# Patient Record
Sex: Female | Born: 1945 | Race: White | Hispanic: No | Marital: Married | Smoking: Former smoker
Health system: Southern US, Community
[De-identification: ages and names within clinical notes are randomized; demographics above are authoritative.]

## PROBLEM LIST (undated history)

## (undated) DIAGNOSIS — G4700 Insomnia, unspecified: Secondary | ICD-10-CM

## (undated) DIAGNOSIS — M1990 Unspecified osteoarthritis, unspecified site: Secondary | ICD-10-CM

## (undated) DIAGNOSIS — H269 Unspecified cataract: Secondary | ICD-10-CM

## (undated) DIAGNOSIS — R011 Cardiac murmur, unspecified: Secondary | ICD-10-CM

## (undated) DIAGNOSIS — R350 Frequency of micturition: Secondary | ICD-10-CM

## (undated) DIAGNOSIS — J45909 Unspecified asthma, uncomplicated: Secondary | ICD-10-CM

## (undated) DIAGNOSIS — Z9981 Dependence on supplemental oxygen: Secondary | ICD-10-CM

## (undated) DIAGNOSIS — R0602 Shortness of breath: Secondary | ICD-10-CM

## (undated) DIAGNOSIS — Z8619 Personal history of other infectious and parasitic diseases: Secondary | ICD-10-CM

## (undated) DIAGNOSIS — Z8669 Personal history of other diseases of the nervous system and sense organs: Secondary | ICD-10-CM

## (undated) DIAGNOSIS — J209 Acute bronchitis, unspecified: Secondary | ICD-10-CM

## (undated) DIAGNOSIS — J449 Chronic obstructive pulmonary disease, unspecified: Secondary | ICD-10-CM

## (undated) DIAGNOSIS — M2550 Pain in unspecified joint: Secondary | ICD-10-CM

## (undated) DIAGNOSIS — K219 Gastro-esophageal reflux disease without esophagitis: Secondary | ICD-10-CM

## (undated) DIAGNOSIS — R351 Nocturia: Secondary | ICD-10-CM

## (undated) DIAGNOSIS — G4730 Sleep apnea, unspecified: Secondary | ICD-10-CM

## (undated) DIAGNOSIS — M2540 Effusion, unspecified joint: Secondary | ICD-10-CM

## (undated) HISTORY — PX: ABDOMINAL HYSTERECTOMY: SHX81

---

## 2002-12-25 ENCOUNTER — Other Ambulatory Visit: Payer: Self-pay

## 2003-09-24 ENCOUNTER — Other Ambulatory Visit: Payer: Self-pay

## 2004-03-19 ENCOUNTER — Emergency Department: Payer: Self-pay | Admitting: Unknown Physician Specialty

## 2004-04-27 ENCOUNTER — Inpatient Hospital Stay: Payer: Self-pay | Admitting: Internal Medicine

## 2004-04-27 ENCOUNTER — Other Ambulatory Visit: Payer: Self-pay

## 2005-07-21 ENCOUNTER — Emergency Department: Payer: Self-pay | Admitting: Emergency Medicine

## 2005-07-25 ENCOUNTER — Emergency Department: Payer: Self-pay | Admitting: Emergency Medicine

## 2006-01-30 ENCOUNTER — Other Ambulatory Visit: Payer: Self-pay

## 2006-01-30 ENCOUNTER — Emergency Department: Payer: Self-pay | Admitting: Emergency Medicine

## 2006-04-23 ENCOUNTER — Other Ambulatory Visit: Payer: Self-pay

## 2006-04-23 ENCOUNTER — Emergency Department: Payer: Self-pay | Admitting: Emergency Medicine

## 2007-04-18 ENCOUNTER — Emergency Department (HOSPITAL_COMMUNITY): Admission: EM | Admit: 2007-04-18 | Discharge: 2007-04-18 | Payer: Self-pay | Admitting: Emergency Medicine

## 2007-04-27 ENCOUNTER — Emergency Department (HOSPITAL_COMMUNITY): Admission: EM | Admit: 2007-04-27 | Discharge: 2007-04-27 | Payer: Self-pay | Admitting: Emergency Medicine

## 2007-07-30 ENCOUNTER — Ambulatory Visit: Payer: Self-pay | Admitting: Specialist

## 2007-09-04 ENCOUNTER — Emergency Department: Payer: Self-pay | Admitting: Unknown Physician Specialty

## 2007-09-16 ENCOUNTER — Emergency Department: Payer: Self-pay | Admitting: Emergency Medicine

## 2008-06-09 ENCOUNTER — Emergency Department (HOSPITAL_COMMUNITY): Admission: EM | Admit: 2008-06-09 | Discharge: 2008-06-09 | Payer: Self-pay | Admitting: Emergency Medicine

## 2008-06-18 ENCOUNTER — Emergency Department (HOSPITAL_COMMUNITY): Admission: EM | Admit: 2008-06-18 | Discharge: 2008-06-18 | Payer: Self-pay | Admitting: Emergency Medicine

## 2008-09-27 ENCOUNTER — Emergency Department (HOSPITAL_COMMUNITY): Admission: EM | Admit: 2008-09-27 | Discharge: 2008-09-27 | Payer: Self-pay | Admitting: Emergency Medicine

## 2009-04-01 IMAGING — CT CT HEAD W/O CM
1 of 2 series · 16 of 30 positions shown, 20 images · IV contrast (agent unspecified)
Comparison: none

CLINICAL DATA: Altered loss of consciousness. 
 HEAD CT WITHOUT CONTRAST:
TECHNIQUE: Contiguous axial images were obtained from the base of the skull through the vertex according to standard protocol without contrast.

[Series 2: head_seq 4.5 h37s st · axial · 0.43mm/px · z∈[-131,-1]mm · 16 of 32 slices shown, 20 images]
[im 2/32  brain]
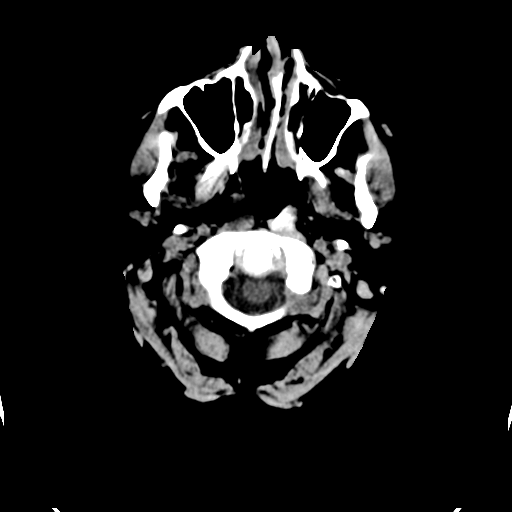
[im 2/32  bone]
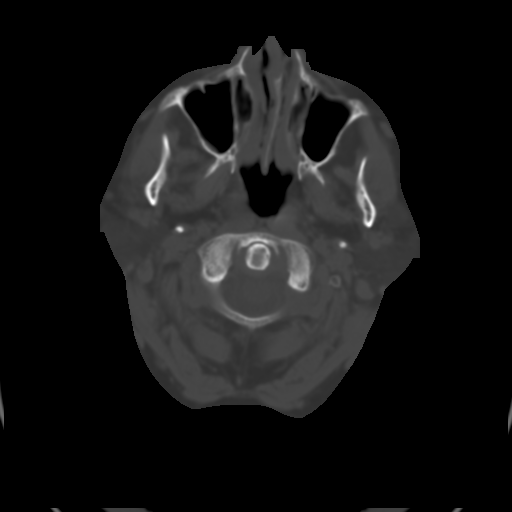
[im 4/32  brain]
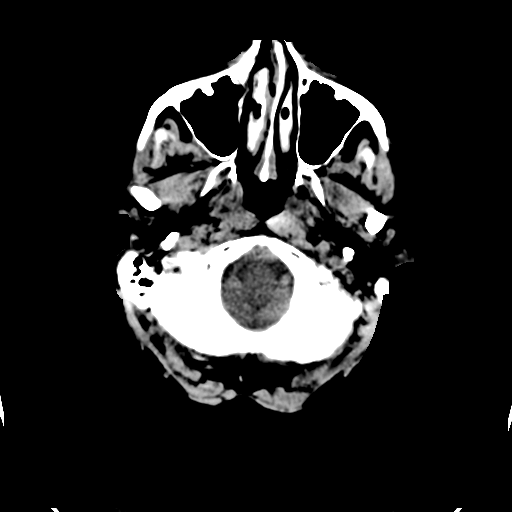
[im 5/32  brain]
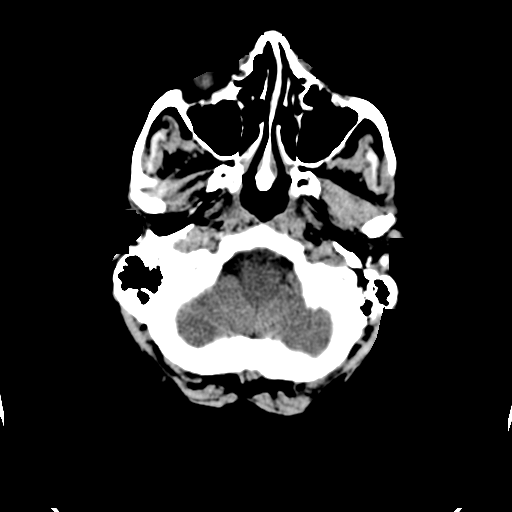
[im 8/32  brain]
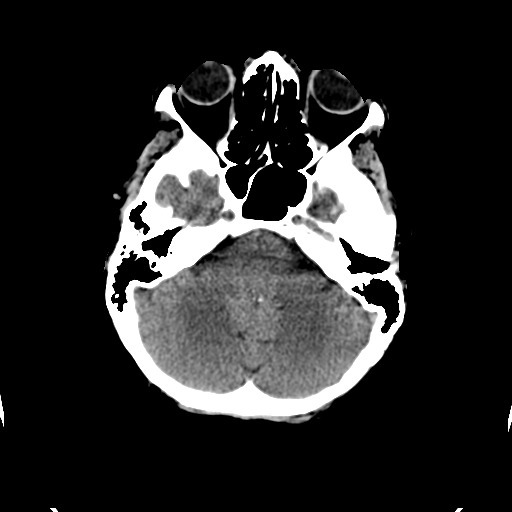
[im 10/32  brain]
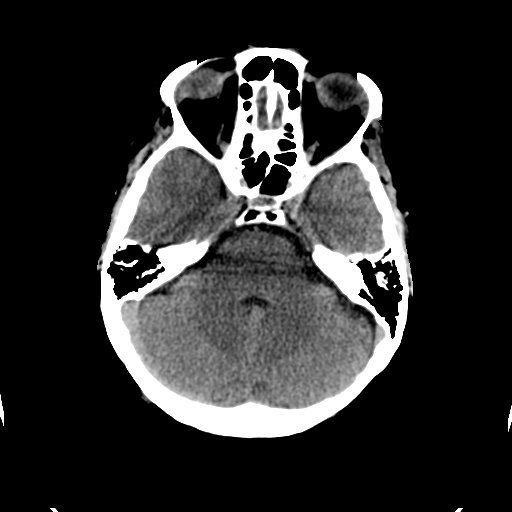
[im 10/32  bone]
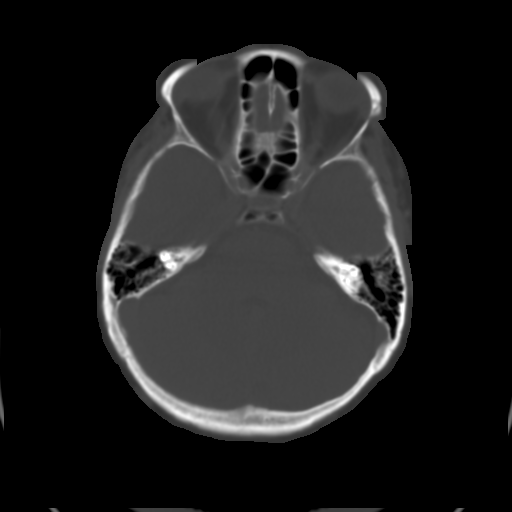
[im 11/32  brain]
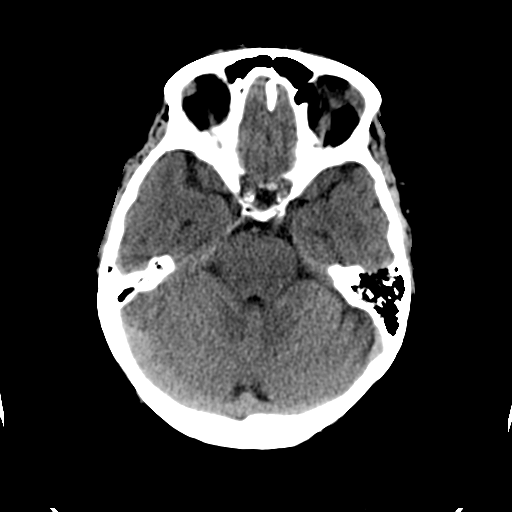
[im 14/32  brain]
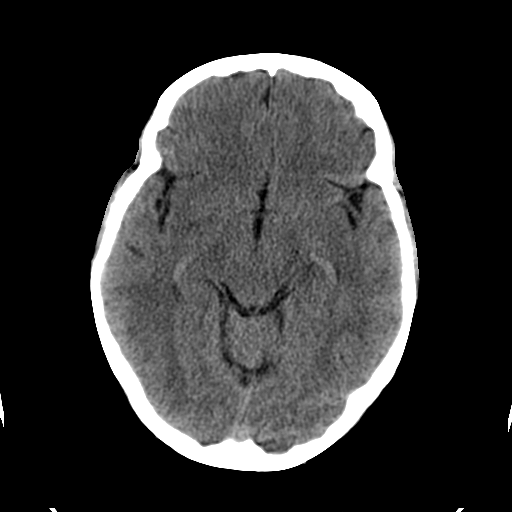
[im 15/32  brain]
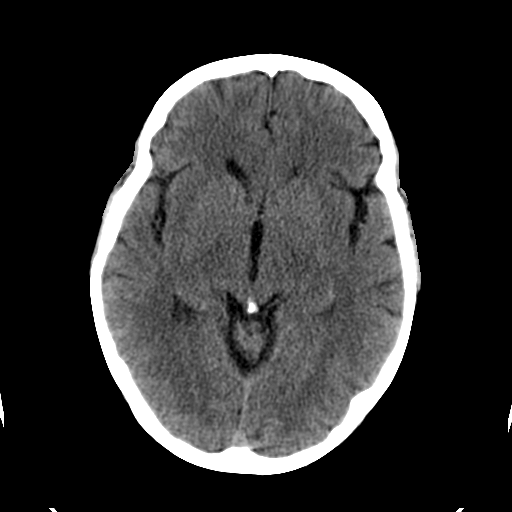
[im 17/32  brain]
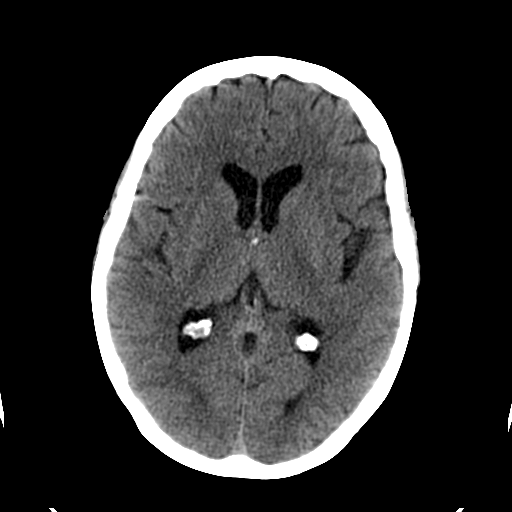
[im 17/32  bone]
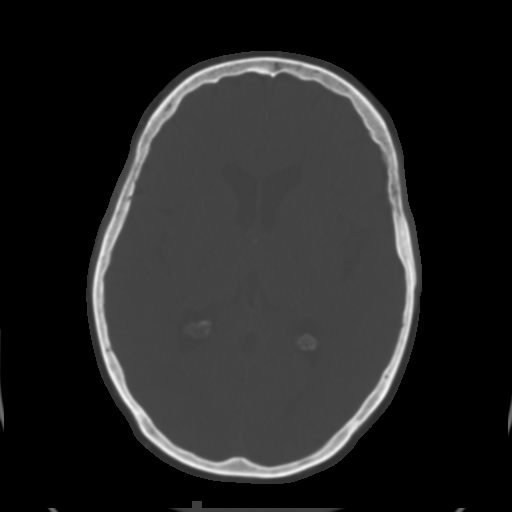
[im 18/32  brain]
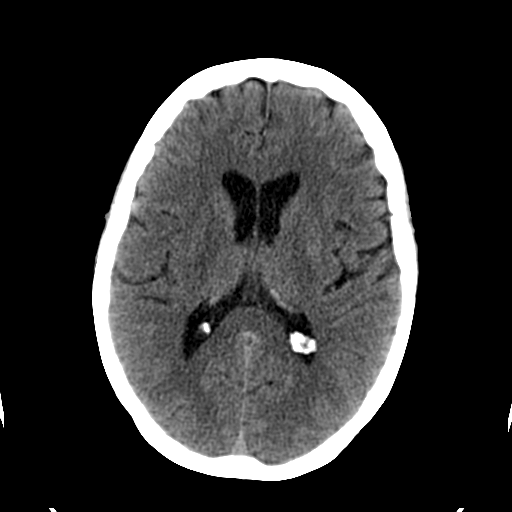
[im 21/32  brain]
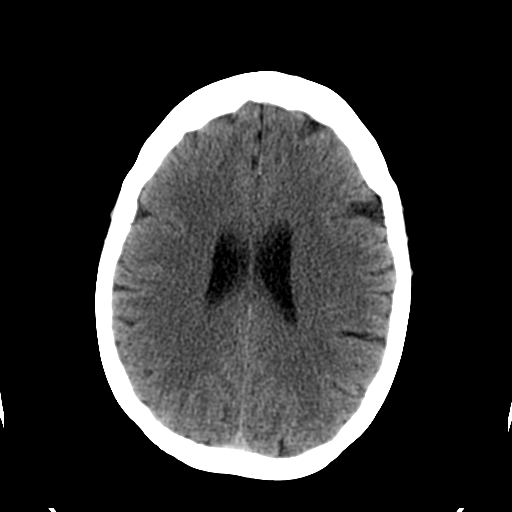
[im 22/32  brain]
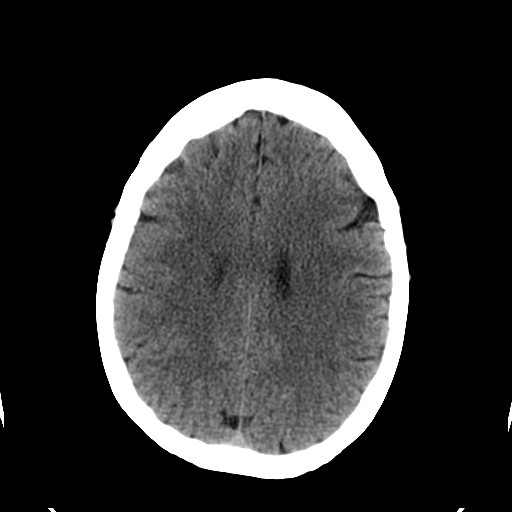
[im 24/32  brain]
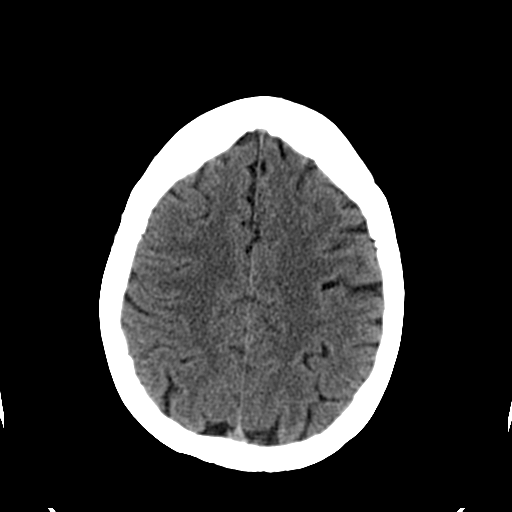
[im 24/32  bone]
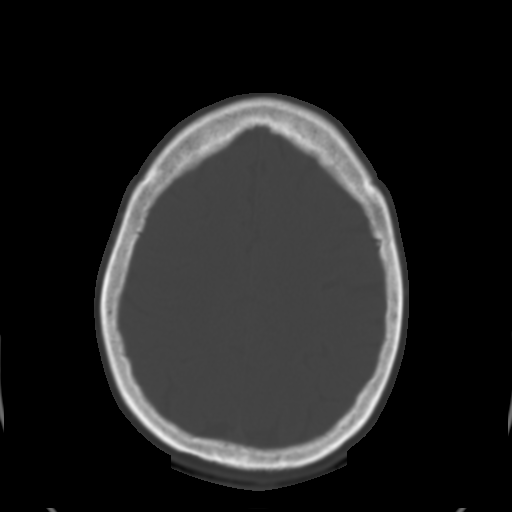
[im 27/32  brain]
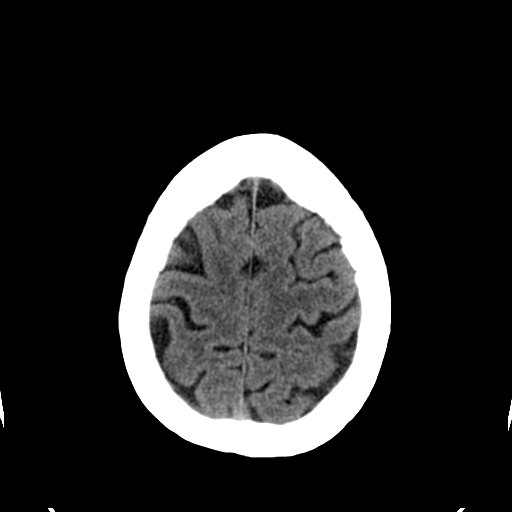
[im 28/32  brain]
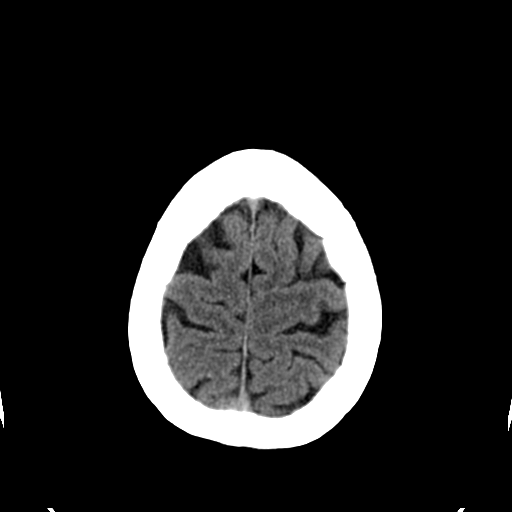
[im 30/32  brain]
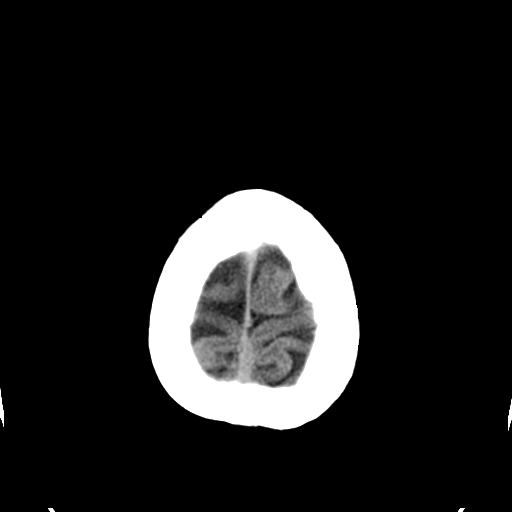

[16 of 30 positions shown; findings below may reference images not displayed]

FINDINGS: No intracranial hemorrhage.  No CT evidence of large acute infarct.  No hydrocephalus.  Cerebellar tonsils slightly low-lying.  Visualized sinuses and mastoid air cells are clear.
IMPRESSION: 1.  No intracranial hemorrhage.  
 2.  No CT evidence of large acute infarct. 
 3.  Cerebellar tonsils slightly low-lying.
 4.  No intracranial mass detected on this unenhanced examination.

## 2009-04-08 ENCOUNTER — Emergency Department (HOSPITAL_COMMUNITY): Admission: EM | Admit: 2009-04-08 | Discharge: 2009-04-09 | Payer: Self-pay | Admitting: Emergency Medicine

## 2009-09-01 ENCOUNTER — Emergency Department (HOSPITAL_COMMUNITY): Admission: EM | Admit: 2009-09-01 | Discharge: 2009-09-01 | Payer: Self-pay | Admitting: Emergency Medicine

## 2010-05-28 LAB — CBC
MCV: 96.1 fL (ref 78.0–100.0)
RBC: 3.95 MIL/uL (ref 3.87–5.11)
WBC: 4.6 10*3/uL (ref 4.0–10.5)

## 2010-05-28 LAB — DIFFERENTIAL
Basophils Absolute: 0 10*3/uL (ref 0.0–0.1)
Eosinophils Relative: 6 % — ABNORMAL HIGH (ref 0–5)
Lymphs Abs: 2 10*3/uL (ref 0.7–4.0)
Monocytes Absolute: 0.5 10*3/uL (ref 0.1–1.0)

## 2010-11-11 LAB — BASIC METABOLIC PANEL
CO2: 28
Chloride: 105
Creatinine, Ser: 0.69
GFR calc Af Amer: >60
GFR calc non Af Amer: >60

## 2010-11-11 LAB — RAPID URINE DRUG SCREEN, HOSP PERFORMED
Barbiturates: NOT DETECTED
Benzodiazepines: NOT DETECTED
Cocaine: NOT DETECTED
Opiates: NOT DETECTED

## 2010-11-11 LAB — POCT CARDIAC MARKERS
Myoglobin, poc: 44.4
Operator id: 4001

## 2010-11-11 LAB — DIFFERENTIAL
Basophils Relative: 1
Eosinophils Relative: 7 — ABNORMAL HIGH
Lymphs Abs: 2.1
Monocytes Absolute: 0.7
Monocytes Relative: 10
Neutrophils Relative %: 54

## 2010-11-11 LAB — URINE MICROSCOPIC-ADD ON

## 2010-11-11 LAB — CBC: HCT: 40.7

## 2010-11-11 LAB — URINALYSIS, ROUTINE W REFLEX MICROSCOPIC
Hgb urine dipstick: NEGATIVE
Protein, ur: NEGATIVE
Urobilinogen, UA: 0.2

## 2010-11-14 LAB — POCT CARDIAC MARKERS
CKMB, poc: 1.4
Myoglobin, poc: 60.1
Troponin i, poc: <0.05

## 2010-11-14 LAB — POCT I-STAT CREATININE: Creatinine, Ser: 1

## 2010-11-14 LAB — I-STAT 8, (EC8 V) (CONVERTED LAB)
BUN: 14
Bicarbonate: 22.6
HCT: 45
Hemoglobin: 15.3 — ABNORMAL HIGH
Potassium: 3.3 — ABNORMAL LOW

## 2010-11-14 LAB — CBC
Hemoglobin: 14.4
MCHC: 34.3
MCV: 93.4
RDW: 12.1
WBC: 8.5

## 2010-11-14 LAB — DIFFERENTIAL: Lymphocytes Relative: 23

## 2011-07-25 ENCOUNTER — Ambulatory Visit: Payer: Self-pay | Admitting: Family Medicine

## 2011-11-16 ENCOUNTER — Emergency Department: Payer: Self-pay | Admitting: Emergency Medicine

## 2011-11-16 LAB — BASIC METABOLIC PANEL
Anion Gap: 9 (ref 7–16)
BUN: 12 mg/dL (ref 7–18)
Calcium, Total: 8.8 mg/dL (ref 8.5–10.1)
Co2: 28 mmol/L (ref 21–32)
EGFR (African American): >60
EGFR (Non-African Amer.): >60
Glucose: 76 mg/dL (ref 65–99)
Osmolality: 285 (ref 275–301)
Sodium: 144 mmol/L (ref 136–145)

## 2011-11-16 LAB — CBC
HGB: 12.8 g/dL (ref 12.0–16.0)
MCV: 93 fL (ref 80–100)
RBC: 3.99 10*6/uL (ref 3.80–5.20)

## 2011-11-16 LAB — CK TOTAL AND CKMB (NOT AT ARMC)
CK, Total: 74 U/L (ref 21–215)
CK-MB: 0.9 ng/mL (ref 0.5–3.6)

## 2011-11-16 LAB — PROTIME-INR
INR: 1.1
Prothrombin Time: 14.2 secs (ref 11.5–14.7)

## 2012-09-04 ENCOUNTER — Emergency Department: Payer: Self-pay | Admitting: Emergency Medicine

## 2012-10-16 ENCOUNTER — Emergency Department: Payer: Self-pay | Admitting: Internal Medicine

## 2012-10-16 LAB — DRUG SCREEN, URINE
Amphetamines, Ur Screen: NEGATIVE (ref ?–1000)
Barbiturates, Ur Screen: NEGATIVE (ref ?–200)
Benzodiazepine, Ur Scrn: NEGATIVE (ref ?–200)
Opiate, Ur Screen: NEGATIVE (ref ?–300)
Phencyclidine (PCP) Ur S: NEGATIVE (ref ?–25)
Tricyclic, Ur Screen: NEGATIVE (ref ?–1000)

## 2012-10-16 LAB — CBC
HCT: 37.2 % (ref 35.0–47.0)
HGB: 12.9 g/dL (ref 12.0–16.0)
MCH: 31.3 pg (ref 26.0–34.0)
MCHC: 34.5 g/dL (ref 32.0–36.0)
MCV: 91 fL (ref 80–100)
RBC: 4.11 10*6/uL (ref 3.80–5.20)
WBC: 3.7 10*3/uL (ref 3.6–11.0)

## 2012-10-16 LAB — COMPREHENSIVE METABOLIC PANEL
Alkaline Phosphatase: 86 U/L (ref 50–136)
Anion Gap: 3 — ABNORMAL LOW (ref 7–16)
Calcium, Total: 8.7 mg/dL (ref 8.5–10.1)
Chloride: 110 mmol/L — ABNORMAL HIGH (ref 98–107)
Creatinine: 0.58 mg/dL — ABNORMAL LOW (ref 0.60–1.30)
EGFR (African American): >60
EGFR (Non-African Amer.): >60
Osmolality: 282 (ref 275–301)
Potassium: 3.5 mmol/L (ref 3.5–5.1)
SGOT(AST): 16 U/L (ref 15–37)
Sodium: 141 mmol/L (ref 136–145)
Total Protein: 6.5 g/dL (ref 6.4–8.2)

## 2012-10-16 LAB — URINALYSIS, COMPLETE
Bacteria: NONE SEEN
Bilirubin,UR: NEGATIVE
Nitrite: NEGATIVE
Ph: 5 (ref 4.5–8.0)
Protein: NEGATIVE
RBC,UR: <1 /HPF (ref 0–5)
Squamous Epithelial: 2
WBC UR: 2 /HPF (ref 0–5)

## 2012-10-16 LAB — CK TOTAL AND CKMB (NOT AT ARMC)
CK, Total: 60 U/L (ref 21–215)
CK-MB: 0.8 ng/mL (ref 0.5–3.6)

## 2012-10-16 LAB — TROPONIN I: Troponin-I: <0.02 ng/mL

## 2013-03-03 ENCOUNTER — Encounter (HOSPITAL_COMMUNITY): Payer: Self-pay | Admitting: Pharmacy Technician

## 2013-03-03 ENCOUNTER — Other Ambulatory Visit: Payer: Self-pay | Admitting: Orthopedic Surgery

## 2013-03-07 ENCOUNTER — Inpatient Hospital Stay (HOSPITAL_COMMUNITY)
Admission: RE | Admit: 2013-03-07 | Discharge: 2013-03-07 | Disposition: A | Discharge status: Home / Self Care | Payer: Self-pay | Source: Ambulatory Visit

## 2013-03-07 NOTE — Pre-Procedure Instructions (Signed)
Madison Frank  03/07/2013   Your procedure is scheduled on:  Monday, March 17, 2013 @ 7:30 AM  Report to [facility] (use Main Entrance "A'') at 5:30 AM.  Call this number if you have problems the morning of surgery: [phone]   Remember:   Do not eat food or drink liquids after midnight.   Take these medicines the morning of surgery with A SIP OF WATER: If needed:traMADol (ULTRAM) 50 MG tablet for moderate pain. Stop taking Aspirin, vitamins and herbal medications. Do not take any NSAIDs ie: Ibuprofen, Advil, Naproxen or any medication containing Aspirin.  Do not wear jewelry, make-up or nail polish.  Do not wear lotions, powders, or perfumes. You may wear deodorant.  Do not shave 48 hours prior to surgery.  Do not bring valuables to the hospital.  [facility] is not responsible for any belongings or valuables.               Contacts, dentures or bridgework may not be worn into surgery.  Leave suitcase in the car. After surgery it may be brought to your room.  For patients admitted to the hospital, discharge time is determined by your treatment team.               Patients discharged the day of surgery will not be allowed to drive home.  Name and phone number of your driver:  Special Instructions: Shower using CHG 2 nights before surgery and the night before surgery.  If you shower the day of surgery use CHG.  Use special wash - you have one bottle of CHG for all showers.  You should use approximately 1/3 of the bottle for each shower.   Please read over the following fact sheets that you were given: Pain Booklet, Coughing and Deep Breathing, Blood Transfusion Information, Total Joint Packet, MRSA Information and Surgical Site Infection Prevention

## 2013-03-07 NOTE — Progress Notes (Signed)
Pt scheduled for PAT today at 10:00AM. Pt did not show or call to cancel appointment.  Several unsuccessful attempts were made to contact pt at home phone however, pt phone line gave a "busy" signal.

## 2013-03-13 ENCOUNTER — Encounter (HOSPITAL_COMMUNITY)
Admission: RE | Admit: 2013-03-13 | Discharge: 2013-03-13 | Disposition: A | Discharge status: Home / Self Care | Payer: Medicare Other | Source: Ambulatory Visit | Attending: Orthopedic Surgery | Admitting: Orthopedic Surgery

## 2013-03-13 ENCOUNTER — Encounter (HOSPITAL_COMMUNITY): Payer: Self-pay

## 2013-03-13 DIAGNOSIS — Z01811 Encounter for preprocedural respiratory examination: Secondary | ICD-10-CM | POA: Insufficient documentation

## 2013-03-13 DIAGNOSIS — Z01810 Encounter for preprocedural cardiovascular examination: Secondary | ICD-10-CM | POA: Insufficient documentation

## 2013-03-13 DIAGNOSIS — Z01818 Encounter for other preprocedural examination: Secondary | ICD-10-CM | POA: Insufficient documentation

## 2013-03-13 DIAGNOSIS — Z01812 Encounter for preprocedural laboratory examination: Secondary | ICD-10-CM | POA: Insufficient documentation

## 2013-03-13 HISTORY — DX: Gastro-esophageal reflux disease without esophagitis: K21.9

## 2013-03-13 HISTORY — DX: Sleep apnea, unspecified: G47.30

## 2013-03-13 HISTORY — DX: Acute bronchitis, unspecified: J20.9

## 2013-03-13 HISTORY — DX: Dependence on supplemental oxygen: Z99.81

## 2013-03-13 HISTORY — DX: Nocturia: R35.1

## 2013-03-13 HISTORY — DX: Shortness of breath: R06.02

## 2013-03-13 HISTORY — DX: Chronic obstructive pulmonary disease, unspecified: J44.9

## 2013-03-13 HISTORY — DX: Cardiac murmur, unspecified: R01.1

## 2013-03-13 HISTORY — DX: Frequency of micturition: R35.0

## 2013-03-13 HISTORY — DX: Personal history of other diseases of the nervous system and sense organs: Z86.69

## 2013-03-13 HISTORY — DX: Personal history of other infectious and parasitic diseases: Z86.19

## 2013-03-13 HISTORY — DX: Unspecified osteoarthritis, unspecified site: M19.90

## 2013-03-13 HISTORY — DX: Pain in unspecified joint: M25.50

## 2013-03-13 HISTORY — DX: Unspecified cataract: H26.9

## 2013-03-13 HISTORY — DX: Insomnia, unspecified: G47.00

## 2013-03-13 HISTORY — DX: Effusion, unspecified joint: M25.40

## 2013-03-13 HISTORY — DX: Unspecified asthma, uncomplicated: J45.909

## 2013-03-13 LAB — TYPE AND SCREEN
ABO/RH(D): AB POS
ANTIBODY SCREEN: NEGATIVE

## 2013-03-13 LAB — COMPREHENSIVE METABOLIC PANEL
ALT: 11 U/L (ref 0–35)
AST: 17 U/L (ref 0–37)
Albumin: 3.8 g/dL (ref 3.5–5.2)
Alkaline Phosphatase: 76 U/L (ref 39–117)
BUN: 13 mg/dL (ref 6–23)
CALCIUM: 8.8 mg/dL (ref 8.4–10.5)
CO2: 25 mEq/L (ref 19–32)
Chloride: 104 mEq/L (ref 96–112)
Creatinine, Ser: 0.55 mg/dL (ref 0.50–1.10)
GFR calc Af Amer: >90 mL/min (ref >90)
GFR calc non Af Amer: >90 mL/min (ref >90)
Glucose, Bld: 82 mg/dL (ref 70–99)
Potassium: 3.8 mEq/L (ref 3.7–5.3)
Sodium: 142 mEq/L (ref 137–147)
TOTAL PROTEIN: 7.3 g/dL (ref 6.0–8.3)
Total Bilirubin: <0.2 mg/dL — ABNORMAL LOW (ref 0.3–1.2)

## 2013-03-13 LAB — CBC WITH DIFFERENTIAL/PLATELET
BASOS PCT: 1 % (ref 0–1)
Basophils Absolute: 0 10*3/uL (ref 0.0–0.1)
EOS ABS: 0.3 10*3/uL (ref 0.0–0.7)
Eosinophils Relative: 5 % (ref 0–5)
HEMATOCRIT: 40.3 % (ref 36.0–46.0)
Hemoglobin: 13.8 g/dL (ref 12.0–15.0)
Lymphocytes Relative: 33 % (ref 12–46)
Lymphs Abs: 1.9 10*3/uL (ref 0.7–4.0)
MCH: 31.2 pg (ref 26.0–34.0)
MCHC: 34.2 g/dL (ref 30.0–36.0)
MCV: 91 fL (ref 78.0–100.0)
MONOS PCT: 9 % (ref 3–12)
Monocytes Absolute: 0.5 10*3/uL (ref 0.1–1.0)
Neutro Abs: 2.9 10*3/uL (ref 1.7–7.7)
Neutrophils Relative %: 52 % (ref 43–77)
Platelets: 274 10*3/uL (ref 150–400)
RBC: 4.43 MIL/uL (ref 3.87–5.11)
RDW: 13.1 % (ref 11.5–15.5)
WBC: 5.6 10*3/uL (ref 4.0–10.5)

## 2013-03-13 LAB — URINALYSIS, ROUTINE W REFLEX MICROSCOPIC
BILIRUBIN URINE: NEGATIVE
Glucose, UA: NEGATIVE mg/dL
Hgb urine dipstick: NEGATIVE
Ketones, ur: NEGATIVE mg/dL
Leukocytes, UA: NEGATIVE
NITRITE: NEGATIVE
Protein, ur: NEGATIVE mg/dL
Specific Gravity, Urine: 1.026 (ref 1.005–1.030)
UROBILINOGEN UA: 0.2 mg/dL (ref 0.0–1.0)
pH: 6 (ref 5.0–8.0)

## 2013-03-13 LAB — PROTIME-INR
INR: 1.04 (ref 0.00–1.49)
PROTHROMBIN TIME: 13.4 s (ref 11.6–15.2)

## 2013-03-13 LAB — SURGICAL PCR SCREEN
MRSA, PCR: NEGATIVE
STAPHYLOCOCCUS AUREUS: NEGATIVE

## 2013-03-13 LAB — APTT: aPTT: 29 seconds (ref 24–37)

## 2013-03-13 LAB — ABO/RH: ABO/RH(D): AB POS

## 2013-03-13 MED ORDER — CHLORHEXIDINE GLUCONATE 4 % EX LIQD: 60.0000 mL | Freq: Once | CUTANEOUS | Status: DC

## 2013-03-13 NOTE — Pre-Procedure Instructions (Signed)
Cathleen FearsVickie L Renshaw  03/13/2013   Your procedure is scheduled on:  Mon, Jan 26 @ 7:30 AM  [facility] En[facility]  at 5:30 AM.  Call this number if you have problems the morning of surgery: [phone]   Remember:   Do not eat food or drink liquids after midnight.   Take these medicines the morning of surgery with A SIP OF WATER: Tramadol(Ultram-if needed)              Stop taking your Aspirin. No Goody's,BC's,Aleve,Ibuprofen,Fish Oil,or any Herbal Medications   Do not wear jewelry, make-up or nail polish.  Do not wear lotions, powders, or perfumes. You may wear deodorant.  Do not shave 48 hours prior to surgery.   Do not bring valuables to the hospital.  [facility] is not responsible                  for any belongings or valuables.               Contacts, dentures or bridgework may not be worn into surgery.  Leave suitcase in the car. After surgery it may be brought to your room.  For patients admitted to the hospital, discharge time is determined by your                treatment team.                 Special Instructions: Shower using CHG 2 nights before surgery and the night before surgery.  If you shower the day of surgery use CHG.  Use special wash - you have one bottle of CHG for all showers.  You should use approximately 1/3 of the bottle for each shower.   Please read over the following fact sheets that you were given: Pain Booklet, Coughing and Deep Breathing, Blood Transfusion Information, MRSA Information and Surgical Site Infection Prevention

## 2013-03-13 NOTE — Progress Notes (Signed)
Pt doesn't have a cardiologist  Stress test done but unsure bc of it being so long ago-done as a routine physical  Echo has been done but can't remember when or where it was done  Denies ever having a heart cath  Medical MD is with [facility]

## 2013-03-14 LAB — URINE CULTURE: Colony Count: 40000

## 2013-03-16 MED ORDER — CEFAZOLIN SODIUM-DEXTROSE 2-3 GM-% IV SOLR
2.0000 g | INTRAVENOUS | Status: AC
  Administered 2013-03-17: 2 g via INTRAVENOUS
  Filled 2013-03-16: qty 50

## 2013-03-16 MED ORDER — SODIUM CHLORIDE 0.9 % IV SOLN
1000.0000 mg | INTRAVENOUS | Status: AC
  Administered 2013-03-17: 1000 mg via INTRAVENOUS
  Filled 2013-03-16: qty 10

## 2013-03-16 MED ORDER — BUPIVACAINE LIPOSOME 1.3 % IJ SUSP
20.0000 mL | Freq: Once | INTRAMUSCULAR | Status: DC
  Filled 2013-03-16: qty 20

## 2013-03-16 MED ORDER — SODIUM CHLORIDE 0.9 % IV SOLN: INTRAVENOUS | Status: DC

## 2013-03-17 ENCOUNTER — Encounter (HOSPITAL_COMMUNITY)
Admission: RE | Disposition: A | Discharge status: Home Health | Payer: Self-pay | Source: Ambulatory Visit | Attending: Orthopedic Surgery

## 2013-03-17 ENCOUNTER — Inpatient Hospital Stay (HOSPITAL_COMMUNITY): Payer: Medicare Other | Admitting: Certified Registered"

## 2013-03-17 ENCOUNTER — Inpatient Hospital Stay (HOSPITAL_COMMUNITY)
Admission: RE | Admit: 2013-03-17 | Discharge: 2013-03-18 | DRG: 470 | Disposition: A | Discharge status: Home Health | Payer: Medicare Other | Source: Ambulatory Visit | Attending: Orthopedic Surgery | Admitting: Orthopedic Surgery

## 2013-03-17 ENCOUNTER — Encounter (HOSPITAL_COMMUNITY): Payer: Medicare Other | Admitting: Certified Registered"

## 2013-03-17 ENCOUNTER — Encounter (HOSPITAL_COMMUNITY): Payer: Self-pay | Admitting: *Deleted

## 2013-03-17 DIAGNOSIS — Z87891 Personal history of nicotine dependence: Secondary | ICD-10-CM

## 2013-03-17 DIAGNOSIS — K219 Gastro-esophageal reflux disease without esophagitis: Secondary | ICD-10-CM | POA: Diagnosis present

## 2013-03-17 DIAGNOSIS — J4489 Other specified chronic obstructive pulmonary disease: Secondary | ICD-10-CM | POA: Diagnosis present

## 2013-03-17 DIAGNOSIS — Z9981 Dependence on supplemental oxygen: Secondary | ICD-10-CM

## 2013-03-17 DIAGNOSIS — Z7982 Long term (current) use of aspirin: Secondary | ICD-10-CM

## 2013-03-17 DIAGNOSIS — Z96659 Presence of unspecified artificial knee joint: Secondary | ICD-10-CM

## 2013-03-17 DIAGNOSIS — D62 Acute posthemorrhagic anemia: Secondary | ICD-10-CM | POA: Diagnosis not present

## 2013-03-17 DIAGNOSIS — J449 Chronic obstructive pulmonary disease, unspecified: Secondary | ICD-10-CM | POA: Diagnosis present

## 2013-03-17 DIAGNOSIS — G4730 Sleep apnea, unspecified: Secondary | ICD-10-CM | POA: Diagnosis present

## 2013-03-17 DIAGNOSIS — M1710 Unilateral primary osteoarthritis, unspecified knee: Principal | ICD-10-CM | POA: Diagnosis present

## 2013-03-17 HISTORY — PX: TOTAL KNEE ARTHROPLASTY: SHX125

## 2013-03-17 LAB — CREATININE, SERUM
Creatinine, Ser: 0.54 mg/dL (ref 0.50–1.10)
GFR calc non Af Amer: >90 mL/min (ref >90)

## 2013-03-17 LAB — CBC
HCT: 33.5 % — ABNORMAL LOW (ref 36.0–46.0)
Hemoglobin: 11.3 g/dL — ABNORMAL LOW (ref 12.0–15.0)
MCH: 30.5 pg (ref 26.0–34.0)
MCHC: 33.7 g/dL (ref 30.0–36.0)
MCV: 90.5 fL (ref 78.0–100.0)
PLATELETS: 198 10*3/uL (ref 150–400)
RBC: 3.7 MIL/uL — AB (ref 3.87–5.11)
RDW: 12.9 % (ref 11.5–15.5)
WBC: 9.4 10*3/uL (ref 4.0–10.5)

## 2013-03-17 SURGERY — ARTHROPLASTY, KNEE, TOTAL
Anesthesia: Regional | Site: Knee | Laterality: Left

## 2013-03-17 MED ORDER — SUCCINYLCHOLINE CHLORIDE 20 MG/ML IJ SOLN
INTRAMUSCULAR | Status: AC
  Filled 2013-03-17: qty 1

## 2013-03-17 MED ORDER — NEOSTIGMINE METHYLSULFATE 1 MG/ML IJ SOLN
INTRAMUSCULAR | Status: AC
  Filled 2013-03-17: qty 10

## 2013-03-17 MED ORDER — METHOCARBAMOL 100 MG/ML IJ SOLN
500.0000 mg | Freq: Four times a day (QID) | INTRAVENOUS | Status: DC | PRN
  Filled 2013-03-17: qty 5

## 2013-03-17 MED ORDER — HYDROMORPHONE HCL PF 1 MG/ML IJ SOLN
1.0000 mg | INTRAMUSCULAR | Status: DC | PRN
  Administered 2013-03-17: 1 mg via INTRAVENOUS
  Filled 2013-03-17 (×2): qty 1

## 2013-03-17 MED ORDER — ENOXAPARIN SODIUM 30 MG/0.3ML ~~LOC~~ SOLN
30.0000 mg | Freq: Two times a day (BID) | SUBCUTANEOUS | Status: DC
  Administered 2013-03-18: 30 mg via SUBCUTANEOUS
  Filled 2013-03-17 (×3): qty 0.3

## 2013-03-17 MED ORDER — ALUM & MAG HYDROXIDE-SIMETH 200-200-20 MG/5ML PO SUSP
30.0000 mL | ORAL | Status: DC | PRN
Start: 2013-03-17 — End: 2013-03-18

## 2013-03-17 MED ORDER — CEFAZOLIN SODIUM 1-5 GM-% IV SOLN
1.0000 g | Freq: Four times a day (QID) | INTRAVENOUS | Status: AC
  Administered 2013-03-17 (×2): 1 g via INTRAVENOUS
  Filled 2013-03-17 (×2): qty 50

## 2013-03-17 MED ORDER — ONDANSETRON HCL 4 MG PO TABS: 4.0000 mg | ORAL_TABLET | Freq: Four times a day (QID) | ORAL | Status: DC | PRN

## 2013-03-17 MED ORDER — ROCURONIUM BROMIDE 50 MG/5ML IV SOLN
INTRAVENOUS | Status: AC
  Filled 2013-03-17: qty 1

## 2013-03-17 MED ORDER — BUPIVACAINE-EPINEPHRINE (PF) 0.5% -1:200000 IJ SOLN
INTRAMUSCULAR | Status: AC
  Filled 2013-03-17: qty 10

## 2013-03-17 MED ORDER — MENTHOL 3 MG MT LOZG: 1.0000 | LOZENGE | OROMUCOSAL | Status: DC | PRN

## 2013-03-17 MED ORDER — EPHEDRINE SULFATE 50 MG/ML IJ SOLN
INTRAMUSCULAR | Status: AC
  Filled 2013-03-17: qty 1

## 2013-03-17 MED ORDER — MIDAZOLAM HCL 5 MG/5ML IJ SOLN
INTRAMUSCULAR | Status: DC | PRN
Start: 2013-03-17 — End: 2013-03-17
  Administered 2013-03-17: 2 mg via INTRAVENOUS

## 2013-03-17 MED ORDER — GLYCOPYRROLATE 0.2 MG/ML IJ SOLN
INTRAMUSCULAR | Status: DC | PRN
Start: 2013-03-17 — End: 2013-03-17
  Administered 2013-03-17: 0.4 mg via INTRAVENOUS

## 2013-03-17 MED ORDER — LACTATED RINGERS IV SOLN
INTRAVENOUS | Status: DC | PRN
  Administered 2013-03-17 (×2): via INTRAVENOUS

## 2013-03-17 MED ORDER — PHENYLEPHRINE HCL 10 MG/ML IJ SOLN
INTRAMUSCULAR | Status: DC | PRN
  Administered 2013-03-17: 40 ug via INTRAVENOUS
  Administered 2013-03-17 (×2): 80 ug via INTRAVENOUS
  Administered 2013-03-17: 40 ug via INTRAVENOUS

## 2013-03-17 MED ORDER — FLEET ENEMA 7-19 GM/118ML RE ENEM: 1.0000 | ENEMA | Freq: Once | RECTAL | Status: AC | PRN

## 2013-03-17 MED ORDER — ARTIFICIAL TEARS OP OINT
TOPICAL_OINTMENT | OPHTHALMIC | Status: AC
  Filled 2013-03-17: qty 3.5

## 2013-03-17 MED ORDER — METHOCARBAMOL 500 MG PO TABS: 500.0000 mg | ORAL_TABLET | Freq: Four times a day (QID) | ORAL | Status: DC | PRN

## 2013-03-17 MED ORDER — LIDOCAINE HCL (CARDIAC) 20 MG/ML IV SOLN
INTRAVENOUS | Status: DC | PRN
  Administered 2013-03-17: 60 mg via INTRAVENOUS

## 2013-03-17 MED ORDER — PROPOFOL 10 MG/ML IV BOLUS
INTRAVENOUS | Status: DC | PRN
  Administered 2013-03-17: 140 mg via INTRAVENOUS

## 2013-03-17 MED ORDER — MIDAZOLAM HCL 2 MG/2ML IJ SOLN
INTRAMUSCULAR | Status: AC
  Filled 2013-03-17: qty 2

## 2013-03-17 MED ORDER — BUPIVACAINE-EPINEPHRINE 0.5% -1:200000 IJ SOLN
INTRAMUSCULAR | Status: DC | PRN
Start: 2013-03-17 — End: 2013-03-17
  Administered 2013-03-17: 30 mL

## 2013-03-17 MED ORDER — SENNOSIDES-DOCUSATE SODIUM 8.6-50 MG PO TABS: 1.0000 | ORAL_TABLET | Freq: Every evening | ORAL | Status: DC | PRN

## 2013-03-17 MED ORDER — FENTANYL CITRATE 0.05 MG/ML IJ SOLN
INTRAMUSCULAR | Status: DC | PRN
  Administered 2013-03-17: 50 ug via INTRAVENOUS
  Administered 2013-03-17: 100 ug via INTRAVENOUS
  Administered 2013-03-17: 50 ug via INTRAVENOUS

## 2013-03-17 MED ORDER — BUPIVACAINE LIPOSOME 1.3 % IJ SUSP
INTRAMUSCULAR | Status: DC | PRN
Start: 2013-03-17 — End: 2013-03-17
  Administered 2013-03-17: 20 mL

## 2013-03-17 MED ORDER — PNEUMOCOCCAL VAC POLYVALENT 25 MCG/0.5ML IJ INJ
0.5000 mL | INJECTION | INTRAMUSCULAR | Status: AC
  Administered 2013-03-18: 0.5 mL via INTRAMUSCULAR
  Filled 2013-03-17: qty 0.5

## 2013-03-17 MED ORDER — METOCLOPRAMIDE HCL 5 MG/ML IJ SOLN: 5.0000 mg | Freq: Three times a day (TID) | INTRAMUSCULAR | Status: DC | PRN

## 2013-03-17 MED ORDER — SODIUM CHLORIDE 0.9 % IV SOLN
INTRAVENOUS | Status: DC
  Administered 2013-03-17: 75 mL/h via INTRAVENOUS

## 2013-03-17 MED ORDER — ZOLPIDEM TARTRATE 5 MG PO TABS: 5.0000 mg | ORAL_TABLET | Freq: Every evening | ORAL | Status: DC | PRN

## 2013-03-17 MED ORDER — OXYCODONE HCL ER 10 MG PO T12A
10.0000 mg | EXTENDED_RELEASE_TABLET | Freq: Two times a day (BID) | ORAL | Status: DC
  Administered 2013-03-17 – 2013-03-18 (×3): 10 mg via ORAL
  Filled 2013-03-17 (×3): qty 1

## 2013-03-17 MED ORDER — DIPHENHYDRAMINE HCL 12.5 MG/5ML PO ELIX: 12.5000 mg | ORAL_SOLUTION | ORAL | Status: DC | PRN

## 2013-03-17 MED ORDER — PHENOL 1.4 % MT LIQD: 1.0000 | OROMUCOSAL | Status: DC | PRN

## 2013-03-17 MED ORDER — FENTANYL CITRATE 0.05 MG/ML IJ SOLN
INTRAMUSCULAR | Status: AC
  Filled 2013-03-17: qty 5

## 2013-03-17 MED ORDER — CEFAZOLIN SODIUM-DEXTROSE 2-3 GM-% IV SOLR
INTRAVENOUS | Status: AC
Start: 2013-03-17 — End: 2013-03-17
  Filled 2013-03-17: qty 50

## 2013-03-17 MED ORDER — ROCURONIUM BROMIDE 100 MG/10ML IV SOLN
INTRAVENOUS | Status: DC | PRN
  Administered 2013-03-17: 40 mg via INTRAVENOUS

## 2013-03-17 MED ORDER — PROPOFOL 10 MG/ML IV BOLUS
INTRAVENOUS | Status: AC
  Filled 2013-03-17: qty 20

## 2013-03-17 MED ORDER — ONDANSETRON HCL 4 MG/2ML IJ SOLN
INTRAMUSCULAR | Status: AC
  Filled 2013-03-17: qty 2

## 2013-03-17 MED ORDER — ALBUTEROL SULFATE (2.5 MG/3ML) 0.083% IN NEBU: 3.0000 mL | INHALATION_SOLUTION | Freq: Four times a day (QID) | RESPIRATORY_TRACT | Status: DC | PRN

## 2013-03-17 MED ORDER — DOCUSATE SODIUM 100 MG PO CAPS
100.0000 mg | ORAL_CAPSULE | Freq: Two times a day (BID) | ORAL | Status: DC
Start: 2013-03-17 — End: 2013-03-18
  Administered 2013-03-17 – 2013-03-18 (×3): 100 mg via ORAL
  Filled 2013-03-17 (×4): qty 1

## 2013-03-17 MED ORDER — ACETAMINOPHEN 650 MG RE SUPP: 650.0000 mg | Freq: Four times a day (QID) | RECTAL | Status: DC | PRN

## 2013-03-17 MED ORDER — ONDANSETRON HCL 4 MG/2ML IJ SOLN
4.0000 mg | Freq: Four times a day (QID) | INTRAMUSCULAR | Status: DC | PRN
  Administered 2013-03-17: 4 mg via INTRAVENOUS
  Filled 2013-03-17: qty 2

## 2013-03-17 MED ORDER — FENTANYL CITRATE 0.05 MG/ML IJ SOLN
25.0000 ug | INTRAMUSCULAR | Status: DC | PRN
  Administered 2013-03-17 (×2): 25 ug via INTRAVENOUS

## 2013-03-17 MED ORDER — NEOSTIGMINE METHYLSULFATE 1 MG/ML IJ SOLN
INTRAMUSCULAR | Status: DC | PRN
  Administered 2013-03-17: 3 mg via INTRAVENOUS

## 2013-03-17 MED ORDER — OXYCODONE HCL 5 MG PO TABS
5.0000 mg | ORAL_TABLET | ORAL | Status: DC | PRN
  Administered 2013-03-18: 10 mg via ORAL
  Filled 2013-03-17: qty 2

## 2013-03-17 MED ORDER — LIDOCAINE HCL (CARDIAC) 20 MG/ML IV SOLN
INTRAVENOUS | Status: AC
  Filled 2013-03-17: qty 5

## 2013-03-17 MED ORDER — CELECOXIB 200 MG PO CAPS
200.0000 mg | ORAL_CAPSULE | Freq: Two times a day (BID) | ORAL | Status: DC
  Administered 2013-03-17 – 2013-03-18 (×3): 200 mg via ORAL
  Filled 2013-03-17 (×4): qty 1

## 2013-03-17 MED ORDER — SODIUM CHLORIDE 0.9 % IR SOLN
Status: DC | PRN
  Administered 2013-03-17: 1000 mL

## 2013-03-17 MED ORDER — GLYCOPYRROLATE 0.2 MG/ML IJ SOLN
INTRAMUSCULAR | Status: AC
  Filled 2013-03-17: qty 2

## 2013-03-17 MED ORDER — FENTANYL CITRATE 0.05 MG/ML IJ SOLN
INTRAMUSCULAR | Status: AC
  Filled 2013-03-17: qty 2

## 2013-03-17 MED ORDER — ACETAMINOPHEN 325 MG PO TABS: 650.0000 mg | ORAL_TABLET | Freq: Four times a day (QID) | ORAL | Status: DC | PRN

## 2013-03-17 MED ORDER — PHENYLEPHRINE 40 MCG/ML (10ML) SYRINGE FOR IV PUSH (FOR BLOOD PRESSURE SUPPORT)
PREFILLED_SYRINGE | INTRAVENOUS | Status: AC
  Filled 2013-03-17: qty 10

## 2013-03-17 MED ORDER — ARTIFICIAL TEARS OP OINT
TOPICAL_OINTMENT | OPHTHALMIC | Status: DC | PRN
  Administered 2013-03-17: 1 via OPHTHALMIC

## 2013-03-17 MED ORDER — METOCLOPRAMIDE HCL 5 MG PO TABS
5.0000 mg | ORAL_TABLET | Freq: Three times a day (TID) | ORAL | Status: DC | PRN
  Filled 2013-03-17: qty 2

## 2013-03-17 MED ORDER — ONDANSETRON HCL 4 MG/2ML IJ SOLN
INTRAMUSCULAR | Status: DC | PRN
  Administered 2013-03-17: 4 mg via INTRAVENOUS

## 2013-03-17 MED ORDER — BISACODYL 5 MG PO TBEC: 5.0000 mg | DELAYED_RELEASE_TABLET | Freq: Every day | ORAL | Status: DC | PRN

## 2013-03-17 SURGICAL SUPPLY — 53 items
BANDAGE ESMARK 6X9 LF (GAUZE/BANDAGES/DRESSINGS) ×1 IMPLANT
BLADE SAGITTAL 13X1.27X60 (BLADE) ×2 IMPLANT
BLADE SAGITTAL 13X1.27X60MM (BLADE) ×1
BLADE SAW SGTL 83.5X18.5 (BLADE) ×3 IMPLANT
BNDG ESMARK 6X9 LF (GAUZE/BANDAGES/DRESSINGS) ×3
BOWL SMART MIX CTS (DISPOSABLE) ×3 IMPLANT
CAP POR TM CP VIT E LN CER HD ×3 IMPLANT
CEMENT BONE SIMPLEX SPEEDSET (Cement) ×6 IMPLANT
COVER SURGICAL LIGHT HANDLE (MISCELLANEOUS) ×3 IMPLANT
CUFF TOURNIQUET SINGLE 34IN LL (TOURNIQUET CUFF) ×3 IMPLANT
DRAPE EXTREMITY T 121X128X90 (DRAPE) ×3 IMPLANT
DRAPE INCISE IOBAN 66X45 STRL (DRAPES) ×6 IMPLANT
DRAPE PROXIMA HALF (DRAPES) ×3 IMPLANT
DRAPE U-SHAPE 47X51 STRL (DRAPES) ×3 IMPLANT
DRSG ADAPTIC 3X8 NADH LF (GAUZE/BANDAGES/DRESSINGS) ×3 IMPLANT
DRSG PAD ABDOMINAL 8X10 ST (GAUZE/BANDAGES/DRESSINGS) ×3 IMPLANT
DURAPREP 26ML APPLICATOR (WOUND CARE) ×6 IMPLANT
ELECT REM PT RETURN 9FT ADLT (ELECTROSURGICAL) ×3
ELECTRODE REM PT RTRN 9FT ADLT (ELECTROSURGICAL) ×1 IMPLANT
EVACUATOR 1/8 PVC DRAIN (DRAIN) ×3 IMPLANT
GLOVE BIOGEL M 7.0 STRL (GLOVE) IMPLANT
GLOVE BIOGEL PI IND STRL 7.5 (GLOVE) IMPLANT
GLOVE BIOGEL PI IND STRL 8.5 (GLOVE) ×2 IMPLANT
GLOVE BIOGEL PI INDICATOR 7.5 (GLOVE)
GLOVE BIOGEL PI INDICATOR 8.5 (GLOVE) ×4
GLOVE SURG ORTHO 8.0 STRL STRW (GLOVE) ×6 IMPLANT
GOWN PREVENTION PLUS XLARGE (GOWN DISPOSABLE) ×6 IMPLANT
GOWN STRL NON-REIN LRG LVL3 (GOWN DISPOSABLE) ×6 IMPLANT
HANDPIECE INTERPULSE COAX TIP (DISPOSABLE) ×2
HOOD PEEL AWAY FACE SHEILD DIS (HOOD) ×12 IMPLANT
KIT BASIN OR (CUSTOM PROCEDURE TRAY) ×3 IMPLANT
KIT ROOM TURNOVER OR (KITS) ×3 IMPLANT
MANIFOLD NEPTUNE II (INSTRUMENTS) ×3 IMPLANT
NEEDLE 22X1 1/2 (OR ONLY) (NEEDLE) ×3 IMPLANT
NS IRRIG 1000ML POUR BTL (IV SOLUTION) ×3 IMPLANT
PACK TOTAL JOINT (CUSTOM PROCEDURE TRAY) ×3 IMPLANT
PAD ARMBOARD 7.5X6 YLW CONV (MISCELLANEOUS) ×6 IMPLANT
PADDING CAST COTTON 6X4 STRL (CAST SUPPLIES) ×3 IMPLANT
SET HNDPC FAN SPRY TIP SCT (DISPOSABLE) ×1 IMPLANT
SPONGE GAUZE 4X4 12PLY (GAUZE/BANDAGES/DRESSINGS) ×3 IMPLANT
STAPLER VISISTAT 35W (STAPLE) ×3 IMPLANT
SUCTION FRAZIER TIP 10 FR DISP (SUCTIONS) ×3 IMPLANT
SUT BONE WAX W31G (SUTURE) ×3 IMPLANT
SUT VIC AB 0 CTB1 27 (SUTURE) ×6 IMPLANT
SUT VIC AB 1 CT1 27 (SUTURE) ×6
SUT VIC AB 1 CT1 27XBRD ANBCTR (SUTURE) ×3 IMPLANT
SUT VIC AB 2-0 CT1 27 (SUTURE) ×4
SUT VIC AB 2-0 CT1 TAPERPNT 27 (SUTURE) ×2 IMPLANT
SYR CONTROL 10ML LL (SYRINGE) ×3 IMPLANT
TOWEL OR 17X24 6PK STRL BLUE (TOWEL DISPOSABLE) ×3 IMPLANT
TOWEL OR 17X26 10 PK STRL BLUE (TOWEL DISPOSABLE) ×3 IMPLANT
TRAY FOLEY CATH 14FR (SET/KITS/TRAYS/PACK) ×3 IMPLANT
WATER STERILE IRR 1000ML POUR (IV SOLUTION) ×6 IMPLANT

## 2013-03-17 NOTE — Progress Notes (Signed)
Orthopedic Tech Progress Note Patient Details:  Madison FearsVickie L Frank 03/03/1945 [id number] CPM applied to Left LE with appropriate settings. OHF applied to bed. Footsie roll provided.  CPM Left Knee CPM Left Knee: On Left Knee Flexion (Degrees): 60 Left Knee Extension (Degrees): 0   Asia R Thompson 03/17/2013, 10:32 AM

## 2013-03-17 NOTE — Addendum Note (Signed)
Addendum created 03/17/13 1123 by Judie Petitharlene Edwards, MD   Modules edited: Anesthesia Attestations, Anesthesia Blocks and Procedures

## 2013-03-17 NOTE — Transfer of Care (Signed)
Immediate Anesthesia Transfer of Care Note  Patient: Madison Frank  Procedure(s) Performed: Procedure(s): LEFT TOTAL KNEE ARTHROPLASTY (Left)  Patient Location: PACU  Anesthesia Type:General  Level of Consciousness: awake, alert  and oriented  Airway & Oxygen Therapy: Patient Spontanous Breathing and Patient connected to face mask oxygen  Post-op Assessment: Report given to PACU RN, Post -op Vital signs reviewed and stable and Patient moving all extremities X 4  Post vital signs: Reviewed and stable  Complications: No apparent anesthesia complications

## 2013-03-17 NOTE — Progress Notes (Signed)
Orthopedic Tech Progress Note Patient Details:  Cathleen FearsVickie L Dobie 12/27/1945 [id number]  Patient ID: Cathleen FearsVickie L Sivley, female   DOB: 04/05/1945, 68 y.o.   MRN: [id number] Placed pt's lle in cpm @ 0-60 degrees @ [address]2005  ,  03/17/2013, 8:07 PM

## 2013-03-17 NOTE — Evaluation (Signed)
Physical Therapy Evaluation Patient Details Name: Madison Frank MRN: [id number] DOB: 1945-03-22 Today's Date: 03/17/2013 Time: 1410-1430 PT Time Calculation (min): 20 min  PT Assessment / Plan / Recommendation History of Present Illness  s/p elective Lt TKA   Clinical Impression  Pt is s/p Lt TKA resulting in the deficits listed below (see PT Problem List).  Pt will benefit from skilled PT to increase their independence and safety with mobility to allow discharge to the venue listed below. Anticipate good progress with rehab; pt highly motivated. Pt will have husband with her 24/7 upon acute D/C.      PT Assessment  Patient needs continued PT services    Follow Up Recommendations  Home health PT;Supervision/Assistance - 24 hour    Does the patient have the potential to tolerate intense rehabilitation      Barriers to Discharge        Equipment Recommendations  None recommended by PT    Recommendations for Other Services OT consult   Frequency 7X/week    Precautions / Restrictions Precautions Precautions: Knee;Fall Precaution Comments: pt given TKA HEP protocal and educated to not have pillow under knee  Restrictions Weight Bearing Restrictions: Yes LLE Weight Bearing: Weight bearing as tolerated   Pertinent Vitals/Pain 9/10; patient repositioned for comfort       Mobility  Bed Mobility Overal bed mobility: Needs Assistance Bed Mobility: Supine to Sit Supine to sit: Supervision;HOB elevated General bed mobility comments: min cues for hand placement and sequencing; HOB elevated and pt relying on handrails  Transfers Overall transfer level: Needs assistance Equipment used: Ambulation equipment used;Rolling walker (2 wheeled) Transfers: Sit to/from Stand Sit to Stand: Min assist General transfer comment: cues for hand placement and sequencing; min (A) to steady secondary to pain and decreased ability to WB through Lt LE  Ambulation/Gait Ambulation/Gait  assistance: Min guard Ambulation Distance (Feet): 10 Feet Assistive device: Rolling walker (2 wheeled) Gait Pattern/deviations: Decreased stance time - left;Decreased step length - right;Antalgic;Wide base of support;Trunk flexed Gait velocity: decreased due to pain  Gait velocity interpretation: Below normal speed for age/gender General Gait Details: cues for gt sequencing and upright posture; pt limited ability to WB through Lt LE fully due to pain; cues for heel strike in Lt LE     Exercises Total Joint Exercises Ankle Circles/Pumps: AROM;Both;10 reps;Supine Quad Sets: AROM;Left;10 reps;Seated   PT Diagnosis: Difficulty walking;Acute pain;Generalized weakness  PT Problem List: Decreased strength;Decreased range of motion;Decreased activity tolerance;Decreased balance;Decreased mobility;Decreased knowledge of use of DME;Decreased safety awareness;Decreased knowledge of precautions;Pain PT Treatment Interventions: DME instruction;Gait training;Stair training;Functional mobility training;Therapeutic activities;Balance training;Therapeutic exercise;Neuromuscular re-education;Patient/family education     PT Goals(Current goals can be found in the care plan section) Acute Rehab PT Goals Patient Stated Goal: to go home tomorrow PT Goal Formulation: With patient Time For Goal Achievement: 03/31/13 Potential to Achieve Goals: Good  Visit Information  Last PT Received On: 03/17/13 Assistance Needed: +1 History of Present Illness: s/p elective Lt TKA        Prior Functioning  Home Living Family/patient expects to be discharged to:: Private residence Living Arrangements: Spouse/significant other Available Help at Discharge: Family;Available 24 hours/day Type of Home: House Home Access: Ramped entrance Home Layout: One level Home Equipment: Walker - 2 wheels;Bedside commode Additional Comments: pt has tub shower  Prior Function Level of Independence: Independent with assistive  device(s) Comments: pt ambuated primarily with cane  Communication Communication: No difficulties Dominant Hand: Right    Cognition  Cognition Arousal/Alertness: Awake/alert  Behavior During Therapy: WFL for tasks assessed/performed Overall Cognitive Status: Within Functional Limits for tasks assessed    Extremity/Trunk Assessment Upper Extremity Assessment Upper Extremity Assessment: Defer to OT evaluation Lower Extremity Assessment Lower Extremity Assessment: LLE deficits/detail LLE Deficits / Details: AROM 70 degrees in sitting  LLE: Unable to fully assess due to pain Cervical / Trunk Assessment Cervical / Trunk Assessment: Normal   Balance Balance Overall balance assessment: Needs assistance Sitting-balance support: Feet supported;Single extremity supported Sitting balance-Leahy Scale: Good Standing balance support: During functional activity;Bilateral upper extremity supported Standing balance-Leahy Scale: Fair  End of Session PT - End of Session Equipment Utilized During Treatment: Gait belt;Oxygen (2L O2) Activity Tolerance: Patient limited by pain Patient left: in chair;with call bell/phone within reach;with family/visitor present Nurse Communication: Mobility status;Precautions;Weight bearing status;Patient requests pain meds CPM Left Knee CPM Left Knee: [address]Off  GP     Donnamarie PoagWest,  [place] , [place] [phone]  03/17/2013, 3:01 PM

## 2013-03-17 NOTE — Care Management Note (Signed)
CARE MANAGEMENT NOTE 03/17/2013  Patient:  Madison FearsORTER,Madison L   Account Number:  [id number]  Date Initiated:  03/17/2013  Documentation initiated by:  Vance PeperBRADY,  Subjective/Objective Assessment:   [age] yr oold female s/p left total knee arthroplasty.     Action/Plan:   PT/OT Eval.  Patient preopertively setup with [facility].   Anticipated DC Date:  03/18/2013   Anticipated DC Plan:  HOME W HOME HEALTH SERVICES         Choice offered to / List presented to:             Status of service:  In process, will continue to follow

## 2013-03-17 NOTE — Anesthesia Preprocedure Evaluation (Addendum)
Anesthesia Evaluation  Patient identified by MRN, date of birth, ID band Patient awake    Airway Mallampati: I TM Distance: >3 FB Neck ROM: Full    Dental  (+) Edentulous Upper and Dental Advisory Given   Pulmonary shortness of breath, asthma , sleep apnea , COPD COPD inhaler, former smoker,  Occasional home O2 breath sounds clear to auscultation        Cardiovascular Rhythm:Regular Rate:Normal     Neuro/Psych    GI/Hepatic GERD-  ,  Endo/Other    Renal/GU      Musculoskeletal   Abdominal   Peds  Hematology   Anesthesia Other Findings   Reproductive/Obstetrics                          Anesthesia Physical Anesthesia Plan  ASA: III  Anesthesia Plan:    Post-op Pain Management:    Induction: Intravenous  Airway Management Planned: Oral ETT  Additional Equipment:   Intra-op Plan:   Post-operative Plan: Extubation in OR  Informed Consent: I have reviewed the patients History and Physical, chart, labs and discussed the procedure including the risks, benefits and alternatives for the proposed anesthesia with the patient or authorized representative who has indicated his/her understanding and acceptance.   Dental advisory given  Plan Discussed with: CRNA and Anesthesiologist  Anesthesia Plan Comments:         Anesthesia Quick Evaluation

## 2013-03-17 NOTE — Anesthesia Postprocedure Evaluation (Signed)
  Anesthesia Post-op Note  Patient: Madison Frank  Procedure(s) Performed: Procedure(s): LEFT TOTAL KNEE ARTHROPLASTY (Left)  Patient Location: PACU  Anesthesia Type:General  Level of Consciousness: awake  Airway and Oxygen Therapy: Patient Spontanous Breathing  Post-op Pain: mild  Post-op Assessment: Post-op Vital signs reviewed  Post-op Vital Signs: Reviewed  Complications: No apparent anesthesia complications

## 2013-03-17 NOTE — Care Management Utilization Note (Signed)
Utilization review completed.  , RN BSN 

## 2013-03-17 NOTE — Op Note (Signed)
TOTAL KNEE REPLACEMENT OPERATIVE NOTE:  03/17/2013  4:08 PM  PATIENT:  Madison Frank  68 y.o. female  PRE-OPERATIVE DIAGNOSIS:  osteoarthritis left knee  POST-OPERATIVE DIAGNOSIS:  osteoarthritis left knee  PROCEDURE:  Procedure(s): LEFT TOTAL KNEE ARTHROPLASTY  SURGEON:  Surgeon(s): Dannielle Huh, MD  PHYSICIAN ASSISTANT: Altamese Cabal, [facility]  ANESTHESIA:   general  DRAINS: Hemovac  SPECIMEN: None  COUNTS:  Correct  TOURNIQUET:   Total Tourniquet Time Documented: Thigh (Right) - 53 minutes Total: Thigh (Right) - 53 minutes   DICTATION:  Indication for procedure:    The patient is a 68 y.o. female who has failed conservative treatment for osteoarthritis left knee.  Informed consent was obtained prior to anesthesia. The risks versus benefits of the operation were explain and in a way the patient can, and did, understand.   On the implant demand matching protocol, this patient scored 8.  Therefore, this patient was not receive a polyethylene insert with vitamin E which is a high demand implant.  Description of procedure:     The patient was taken to the operating room and placed under anesthesia.  The patient was positioned in the usual fashion taking care that all body parts were adequately padded and/or protected.  I foley catheter was not placed.  A tourniquet was applied and the leg prepped and draped in the usual sterile fashion.  The extremity was exsanguinated with the esmarch and tourniquet inflated to 350 mmHg.  Pre-operative range of motion was normal.  The knee was in 5 degree of mild varus.  A midline incision approximately 6-7 inches long was made with a #10 blade.  A new blade was used to make a parapatellar arthrotomy going 2-3 cm into the quadriceps tendon, over the patella, and alongside the medial aspect of the patellar tendon.  A synovectomy was then performed with the #10 blade and forceps. I then elevated the deep MCL off the medial tibial metaphysis  subperiosteally around to the semimembranosus attachment.    I everted the patella and used calipers to measure patellar thickness.  I used the reamer to ream down to appropriate thickness to recreate the native thickness.  I then removed excess bone with the rongeur and sagittal saw.  I used the appropriately sized template and drilled the three lug holes.  I then put the trial in place and measured the thickness with the calipers to ensure recreation of the native thickness.  The trial was then removed and the patella subluxed and the knee brought into flexion.  A homan retractor was place to retract and protect the patella and lateral structures.  A Z-retractor was place medially to protect the medial structures.  The extra-medullary alignment system was used to make cut the tibial articular surface perpendicular to the anamotic axis of the tibia and in 3 degrees of posterior slope.  The cut surface and alignment jig was removed.  I then used the intramedullary alignment guide to make a 6 valgus cut on the distal femur.  I then marked out the epicondylar axis on the distal femur.  The posterior condylar axis measured 5 degrees.  I then used the anterior referencing sizer and measured the femur to be a size 6.  The 4-In-1 cutting block was screwed into place in external rotation matching the posterior condylar angle, making our cuts perpendicular to the epicondylar axis.  Anterior, posterior and chamfer cuts were made with the sagittal saw.  The cutting block and cut pieces were removed.  A lamina spreader was placed in 90 degrees of flexion.  The ACL, PCL, menisci, and posterior condylar osteophytes were removed.  A 13 mm spacer blocked was found to offer good flexion and extension gap balance after mild in degree releasing.   The scoop retractor was then placed and the femoral finishing block was pinned in place.  The small sagittal saw was used as well as the lug drill to finish the femur.  The block  and cut surfaces were removed and the medullary canal hole filled with autograft bone from the cut pieces.  The tibia was delivered forward in deep flexion and external rotation.  A size D tray was selected and pinned into place centered on the medial 1/3 of the tibial tubercle.  The reamer and keel was used to prepare the tibia through the tray.    I then trialed with the size 6 femur, size D tibia, a 13 mm insert and the 32 patella.  I had excellent flexion/extension gap balance, excellent patella tracking.  Flexion was full and beyond 120 degrees; extension was zero.  These components were chosen and the staff opened them to me on the back table while the knee was lavaged copiously and the cement mixed.  The soft tissue was infiltrated with 60cc of exparel 1.3% through a 21 gauge needle.  I cemented in the components and removed all excess cement.  The polyethylene tibial component was snapped into place and the knee placed in extension while cement was hardening.  The capsule was infilltrated with 30cc of .25% Marcaine with epinephrine.  A hemovac was place in the joint exiting superolaterally.  A pain pump was place superomedially superficial to the arthrotomy.  Once the cement was hard, the tourniquet was let down.  Hemostasis was obtained.  The arthrotomy was closed with figure-8 #1 vicryl sutures.  The deep soft tissues were closed with #0 vicryls and the subcuticular layer closed with a running #2-0 vicryl.  The skin was reapproximated and closed with skin staples.  The wound was dressed with xeroform, 4 x4's, 2 ABD sponges, a single layer of webril and a TED stocking.   The patient was then awakened, extubated, and taken to the recovery room in stable condition.  BLOOD LOSS:  300cc DRAINS: 1 hemovac, 1 pain catheter COMPLICATIONS:  None.  PLAN OF CARE: Admit to inpatient   PATIENT DISPOSITION:  PACU - hemodynamically stable.   Delay start of Pharmacological VTE agent (>24hrs) due to  surgical blood loss or risk of bleeding:  not applicable  Please fax a copy of this op note to my office at [phone] (please only include page 1 and 2 of the Case Information op note)

## 2013-03-17 NOTE — H&P (Signed)
Madison Frank MRN:  [id number] DOB/SEX:  1946/01/02/female  CHIEF COMPLAINT:  Painful left Knee  HISTORY: Patient is a 68 y.o. female presented with a history of pain in the left knee. Onset of symptoms was gradual starting several years ago with gradually worsening course since that time. Prior procedures on the knee include none. Patient has been treated conservatively with over-the-counter NSAIDs and activity modification. Patient currently rates pain in the knee at 9 out of 10 with activity. There is pain at night.  PAST MEDICAL HISTORY: There are no active problems to display for this patient.  Past Medical History  Diagnosis Date  . Heart murmur     as a child  . Acute bronchitis   . Shortness of breath     with exertion  . On home O2     @ 2L/m via N/C  . Asthma   . Sleep apnea     CPAP;sleep study done several yrs ago  . History of migraine     last one 03/11/13  . Arthritis   . Joint pain   . Joint swelling   . GERD (gastroesophageal reflux disease)     but doesn't take any meds  . Urinary frequency   . Nocturia   . Cataracts, bilateral     immature  . Insomnia     occasionally but doesn' take any meds   . History of shingles   . COPD (chronic obstructive pulmonary disease)     uses Albuterol as needed;emphysema   Past Surgical History  Procedure Laterality Date  . Abdominal hysterectomy       MEDICATIONS:   Prescriptions prior to admission  Medication Sig Dispense Refill  . albuterol (PROVENTIL HFA;VENTOLIN HFA) 108 (90 BASE) MCG/ACT inhaler Inhale into the lungs every 6 (six) hours as needed for wheezing or shortness of breath.      Marland Kitchen aspirin 325 MG tablet Take 325 mg by mouth every 6 (six) hours as needed for mild pain.      . traMADol (ULTRAM) 50 MG tablet Take 50 mg by mouth every 6 (six) hours as needed for moderate pain.        ALLERGIES:  No Known Allergies  REVIEW OF SYSTEMS:  Pertinent items are noted in HPI.   FAMILY HISTORY:  History  reviewed. No pertinent family history.  SOCIAL HISTORY:   History  Substance Use Topics  . Smoking status: Former Games developer  . Smokeless tobacco: Not on file  . Alcohol Use: No     EXAMINATION:  Vital signs in last 24 hours: Temp:  [98 F (36.7 C)] 98 F (36.7 C) (01/26 0552) Pulse Rate:  [73] 73 (01/26 0552) Resp:  [20] 20 (01/26 0552) BP: (170-198)/(73-81) 170/81 mmHg (01/26 0555) SpO2:  [97 %] 97 % (01/26 0552)  General appearance: alert, cooperative and no distress Lungs: clear to auscultation bilaterally Heart: regular rate and rhythm, S1, S2 normal, no murmur, click, rub or gallop Abdomen: soft, non-tender; bowel sounds normal; no masses,  no organomegaly Extremities: extremities normal, atraumatic, no cyanosis or edema and Homans sign is negative, no sign of DVT Pulses: 2+ and symmetric Skin: Skin color, texture, turgor normal. No rashes or lesions Neurologic: Alert and oriented X 3, normal strength and tone. Normal symmetric reflexes. Normal coordination and gait  Musculoskeletal:  ROM 0-110, Ligaments intact,  Imaging Review Plain radiographs demonstrate severe degenerative joint disease of the left knee. The overall alignment is mild valgus. The bone quality appears to be  good for age and reported activity level.  Assessment/Plan: End stage arthritis, left knee   The patient history, physical examination and imaging studies are consistent with advanced degenerative joint disease of the left knee. The patient has failed conservative treatment.  The clearance notes were reviewed.  After discussion with the patient it was felt that Total Knee Replacement was indicated. The procedure,  risks, and benefits of total knee arthroplasty were presented and reviewed. The risks including but not limited to aseptic loosening, infection, blood clots, vascular injury, stiffness, patella tracking problems complications among others were discussed. The patient acknowledged the explanation,  agreed to proceed with the plan.  , 03/17/2013, 7:08 AM

## 2013-03-17 NOTE — Anesthesia Procedure Notes (Addendum)
Procedure Name: Intubation Date/Time: 03/17/2013 7:37 AM Performed by: Lanell MatarBAKER, KATHRYN M Pre-anesthesia Checklist: Patient identified, Timeout performed, Emergency Drugs available, Suction available and Patient being monitored Patient Re-evaluated:Patient Re-evaluated prior to inductionOxygen Delivery Method: Circle system utilized Preoxygenation: Pre-oxygenation with 100% oxygen Intubation Type: IV induction Ventilation: Mask ventilation without difficulty Laryngoscope Size: Miller and 2 Grade View: Grade I Tube type: Oral Tube size: 7.0 mm Number of attempts: 1 Airway Equipment and Method: Stylet Placement Confirmation: ETT inserted through vocal cords under direct vision,  positive ETCO2,  CO2 detector and breath sounds checked- equal and bilateral Secured at: 23 cm Tube secured with: Tape Dental Injury: Teeth and Oropharynx as per pre-operative assessment     Anesthesia Regional Block:  Femoral nerve block  Pre-Anesthetic Checklist: ,, timeout performed, Correct Patient, Correct Site, Correct Laterality, Correct Procedure, Correct Position, site marked, Risks and benefits discussed,  Surgical consent,  Pre-op evaluation,  At surgeon's request and post-op pain management  Laterality: Left  Prep: chloraprep       Needles:  Injection technique: Single-shot  Needle Type: Stimulator Needle - 80          Additional Needles:  Procedures: Doppler guided and nerve stimulator Femoral nerve block  Nerve Stimulator or Paresthesia:  Response: 0.5 mA,   Additional Responses:   Narrative:  Start time: 03/17/2013 6:55 AM End time: 03/17/2013 7:15 AM Injection made incrementally with aspirations every 5 mL.  Performed by: Personally  Anesthesiologist: Dr. Randa EvensEdwards   Anesthesia Regional Block:   Narrative:  Start time: 03/17/2013 6:55 AM End time: 03/17/2013 7:15 AM

## 2013-03-18 ENCOUNTER — Encounter (HOSPITAL_COMMUNITY): Payer: Self-pay | Admitting: Orthopedic Surgery

## 2013-03-18 LAB — BASIC METABOLIC PANEL
BUN: 10 mg/dL (ref 6–23)
CO2: 26 mEq/L (ref 19–32)
CREATININE: 0.57 mg/dL (ref 0.50–1.10)
Calcium: 8 mg/dL — ABNORMAL LOW (ref 8.4–10.5)
Chloride: 107 mEq/L (ref 96–112)
Glucose, Bld: 101 mg/dL — ABNORMAL HIGH (ref 70–99)
Potassium: 4.5 mEq/L (ref 3.7–5.3)
Sodium: 141 mEq/L (ref 137–147)

## 2013-03-18 LAB — CBC
HCT: 27.7 % — ABNORMAL LOW (ref 36.0–46.0)
Hemoglobin: 9.5 g/dL — ABNORMAL LOW (ref 12.0–15.0)
MCH: 31.6 pg (ref 26.0–34.0)
MCHC: 34.3 g/dL (ref 30.0–36.0)
MCV: 92 fL (ref 78.0–100.0)
Platelets: 161 10*3/uL (ref 150–400)
RBC: 3.01 MIL/uL — ABNORMAL LOW (ref 3.87–5.11)
RDW: 13.2 % (ref 11.5–15.5)
WBC: 5.1 10*3/uL (ref 4.0–10.5)

## 2013-03-18 MED ORDER — ENOXAPARIN SODIUM 40 MG/0.4ML ~~LOC~~ SOLN: 40.0000 mg | SUBCUTANEOUS | Status: DC

## 2013-03-18 MED ORDER — IBUPROFEN 600 MG PO TABS: 600.0000 mg | ORAL_TABLET | Freq: Three times a day (TID) | ORAL | Status: DC

## 2013-03-18 MED ORDER — OXYCODONE HCL 5 MG PO TABS: 5.0000 mg | ORAL_TABLET | ORAL | Status: DC | PRN

## 2013-03-18 NOTE — Discharge Instructions (Signed)
Home Health physical therapy to be provided by [facility] [phone] ° °Diet: As you were doing prior to hospitalization  ° °Activity:  Increase activity slowly as tolerated  °                No lifting or driving for 6 weeks ° °Shower:  May shower without a dressing once there is no drainage from your wound. °                Do NOT wash over the wound. °                °Dressing:  You may change your dressing on Wednesday °                   Then change the dressing daily with sterile 4"x4"s gauze dressing  °                   And TED hose for knees. ° °Weight Bearing:  Weight bearing as tolerated as taught in physical therapy.  Use a                                walker or Crutches as instructed. ° °To prevent constipation: you may use a stool softener such as - °              Colace ( over the counter) 100 mg by mouth twice a day  °              Drink plenty of fluids ( prune juice may be helpful) and high fiber foods °               Miralax ( over the counter) for constipation as needed.   ° °Precautions:  If you experience chest pain or shortness of breath - call 911 immediately               For transfer to the hospital emergency department!! °              If you develop a fever greater that 101 F, purulent drainage from wound,                             increased redness or drainage from wound, or calf pain -- Call the office. ° °Follow- Up Appointment:  Please call for an appointment to be seen on 04/01/13 °                                             [place] office:  [phone] °           [address] , [place] [address] °               ° ° °

## 2013-03-18 NOTE — Evaluation (Signed)
Occupational Therapy Evaluation Patient Details Name: Madison Frank MRN: [id number] DOB: 06-14-1945 Today's Date: 03/18/2013 Time: 5784-6962 OT Time Calculation (min): 24 min  OT Assessment / Plan / Recommendation History of present illness S/p elective Lt TKA    Clinical Impression   Pt presents w/ dx as above. She is currently supervision level for functional mobility and transfers related to ADL's (except tub transfers, recommend min A) as observed today. Pt and pt's husband expressed understanding of ADL's and transfers and reports no further acute OT needs at this time & plan to d/c home later this afternoon. Will sign off.    OT Assessment  Patient does not need any further OT services    Follow Up Recommendations  No OT follow up;Supervision/Assistance - 24 hour    Barriers to Discharge      Equipment Recommendations  Tub/shower bench;Other (comment) (Pt reports that she has other DME)    Recommendations for Other Services    Frequency       Precautions / Restrictions Precautions Precautions: Knee;Fall Restrictions Weight Bearing Restrictions: Yes LLE Weight Bearing: Weight bearing as tolerated   Pertinent Vitals/Pain No c/o, pt denies pain. Repositioned in bed and resting.    ADL  Eating/Feeding: Simulated;Modified independent Where Assessed - Eating/Feeding: Bed level Grooming: Performed;Wash/dry hands;Wash/dry face;Supervision/safety (Standing at sink) Where Assessed - Grooming: Unsupported standing Upper Body Bathing: Simulated;Set up Where Assessed - Upper Body Bathing: Unsupported sitting Lower Body Bathing: Simulated;Supervision/safety;Min guard Where Assessed - Lower Body Bathing: Unsupported sitting;Supported sit to stand Upper Body Dressing: Simulated;Set up Where Assessed - Upper Body Dressing: Unsupported sitting Lower Body Dressing: Performed;Supervision/safety (Pt able to doff socks seated w/o A/E) Where Assessed - Lower Body Dressing: Unsupported  sitting Toilet Transfer: Performed;Supervision/safety Toilet Transfer Method: Sit to Barista: Raised toilet seat with arms (or 3-in-1 over toilet) Toileting - Clothing Manipulation and Hygiene: Performed;Supervision/safety Where Assessed - Engineer, mining and Hygiene: Sit to stand from 3-in-1 or toilet;Standing Tub/Shower Transfer: Simulated;Minimal assistance Tub/Shower Transfer Method: Ambulating Equipment Used: Rolling walker Transfers/Ambulation Related to ADLs: Pt is overall supervision level for functional transfers using RW ADL Comments: Pt and her husband were educated in role of OT and pt participated in ADL retraining session to include Bed mobility, a/e handout/discussion of use of a/e PRN, LB bathing and dressing at home, home safety - removing throw rugs, DME etc, grooming standing at sink and toilet transfers. Pt's husband verbalized understanding of PRN assist for ADL's and she plans to d/c home later today.    OT Diagnosis:    OT Problem List:   OT Treatment Interventions:     OT Goals(Current goals can be found in the care plan section) Acute Rehab OT Goals Patient Stated Goal: To go home this afternoon  Visit Information  Last OT Received On: 03/18/13 Assistance Needed: +1 History of Present Illness: S/p elective Lt TKA        Prior Functioning     Home Living Family/patient expects to be discharged to:: Private residence Living Arrangements: Spouse/significant other Available Help at Discharge: Family;Available 24 hours/day Type of Home: House Home Access: Ramped entrance Home Layout: One level Home Equipment: Walker - 2 wheels;Bedside commode Additional Comments: pt has tub shower  Prior Function Level of Independence: Independent with assistive device(s) Comments: pt ambuated primarily with cane  Communication Communication: No difficulties Dominant Hand: Right    Vision/Perception Vision - History Patient  Visual Report: No change from baseline   Cognition  Cognition Arousal/Alertness:  Awake/alert Behavior During Therapy: WFL for tasks assessed/performed Overall Cognitive Status: Within Functional Limits for tasks assessed    Extremity/Trunk Assessment Upper Extremity Assessment Upper Extremity Assessment: Overall WFL for tasks assessed Lower Extremity Assessment Lower Extremity Assessment: Defer to PT evaluation Cervical / Trunk Assessment Cervical / Trunk Assessment: Normal    Mobility Bed Mobility Overal bed mobility: Modified Independent Bed Mobility: Supine to Sit Supine to sit: Supervision General bed mobility comments: min cues for hand placement and sequencing; HOB flat Transfers Overall transfer level: Needs assistance Equipment used: Rolling walker (2 wheeled) Transfers: Sit to/from Stand Sit to Stand: Supervision General transfer comment: cues for hand placement & controlled descent       Balance Balance Overall balance assessment: No apparent balance deficits (not formally assessed)   End of Session OT - End of Session Equipment Utilized During Treatment: Rolling walker Activity Tolerance: Patient tolerated treatment well Patient left: in bed;with call bell/phone within reach;with family/visitor present CPM Left Knee CPM Left Knee: Off  GO     Alm BustardBarnhill, Amy Beth Dixon 03/18/2013, 12:26 PM

## 2013-03-18 NOTE — Progress Notes (Signed)
SPORTS MEDICINE AND JOINT REPLACEMENT  Georgena Spurling, MD   Altamese Cabal, PA-C [address] [place], [place], [place]  [address]                             [phone]   PROGRESS NOTE  Subjective:  negative for Chest Pain  negative for Shortness of Breath  negative for Nausea/Vomiting   negative for Calf Pain  negative for Bowel Movement   Tolerating Diet: yes         Patient reports pain as 3 on 0-10 scale.    Objective: Vital signs in last 24 hours:   Patient Vitals for the past 24 hrs:  BP Temp Pulse Resp SpO2 Height Weight  03/18/13 0633 132/76 mmHg 98.7 F (37.1 C) 95 18 99 % - -  03/17/13 2300 145/77 mmHg 98.2 F (36.8 C) 98 18 100 % - -  03/17/13 1600 - - - 16 100 % - -  03/17/13 1352 - - - - - 5\' 4"  (1.626 m) 53.978 kg (119 lb)  03/17/13 1230 - 98.2 F (36.8 C) - - - - -  03/17/13 1219 140/72 mmHg - 93 12 100 % - -  03/17/13 1204 139/62 mmHg - 66 13 100 % - -  03/17/13 1149 132/62 mmHg - 68 13 100 % - -  03/17/13 1134 135/74 mmHg - 68 17 100 % - -  03/17/13 1120 135/85 mmHg - 73 15 100 % - -  03/17/13 1104 135/60 mmHg - 65 13 100 % - -  03/17/13 1049 133/58 mmHg - 65 14 100 % - -  03/17/13 1034 133/61 mmHg - 61 13 100 % - -  03/17/13 1019 141/66 mmHg - 72 11 99 % - -  03/17/13 1004 143/59 mmHg - 71 14 100 % - -  03/17/13 1000 - 98 F (36.7 C) - - - - -  03/17/13 0949 143/59 mmHg - 63 11 100 % - -  03/17/13 0935 138/59 mmHg 97.6 F (36.4 C) 79 12 100 % - -    @flow {1959:LAST@   Intake/Output from previous day:   01/26 0701 - 01/27 0700 In: 1720 [P.O.:120; I.V.:1600] Out: 750 [Drains:750]   Intake/Output this shift:       Intake/Output     01/26 0701 - 01/27 0700 01/27 0701 - 01/28 0700   P.O. 120    I.V. (mL/kg) 1600 (29.6)    Total Intake(mL/kg) 1720 (31.9)    Drains 750    Total Output 750     Net +970          Urine Occurrence 2 x       LABORATORY DATA:  Recent Labs  03/13/13 1446 03/17/13 1355 03/18/13 0610  WBC 5.6 9.4 5.1  HGB  13.8 11.3* 9.5*  HCT 40.3 33.5* 27.7*  PLT 274 198 161    Recent Labs  03/13/13 1446 03/17/13 1355 03/18/13 0610  NA 142  --  141  K 3.8  --  4.5  CL 104  --  107  CO2 25  --  26  BUN 13  --  10  CREATININE 0.55 0.54 0.57  GLUCOSE 82  --  101*  CALCIUM 8.8  --  8.0*   Lab Results  Component Value Date   INR 1.04 03/13/2013    Examination:  General appearance: alert, cooperative and no distress Extremities: extremities normal, atraumatic, no cyanosis or edema and Homans  sign is negative, no sign of DVT  Wound Exam: clean, dry, intact   Drainage:  None: wound tissue dry  Motor Exam: EHL and FHL Intact  Sensory Exam: Deep Peroneal normal   Assessment:    1 Day Post-Op  Procedure(s) (LRB): LEFT TOTAL KNEE ARTHROPLASTY (Left)  ADDITIONAL DIAGNOSIS:  Active Problems:   S/P total knee arthroplasty  Acute Blood Loss Anemia   Plan: Physical Therapy as ordered Weight Bearing as Tolerated (WBAT)  DVT Prophylaxis:  Lovenox  DISCHARGE PLAN: Home  DISCHARGE NEEDS: HHPT, CPM, Walker and 3-in-1 comode seat         , 03/18/2013, 7:43 AM

## 2013-03-18 NOTE — Care Management Note (Signed)
CARE MANAGEMENT NOTE 03/18/2013  Patient:  Madison Frank,Madison Frank   Account Number:  [id number]  Date Initiated:  03/17/2013  Documentation initiated by:  Vance PeperBRADY,  Subjective/Objective Assessment:   [age] yr oold female s/p left total knee arthroplasty.     Action/Plan:   PT/OT Eval.  Patient preopertively setup with [facility]. No changes. Patient has DME. Has family support at Discharge.   Anticipated DC Date:  03/18/2013   Anticipated DC Plan:  HOME W HOME HEALTH SERVICES      DC Planning Services  CM consult      [facility]  [facility]  DURABLE MEDICAL EQUIPMENT   Choice offered to / List presented to:  C-1 Patient   DME arranged  CPM      DME agency  [employer]     HH arranged  HH-2 PT      [facility]   Status of service:  Completed, signed off Medicare Important Message given?   (If response is "NO", the following Medicare IM given date fields will be blank) Date Medicare IM given:   Date Additional Medicare IM given:    Discharge Disposition:  HOME W HOME HEALTH SERVICES

## 2013-03-18 NOTE — Progress Notes (Signed)
Physical Therapy Treatment Patient Details Name: Madison FearsVickie L Goforth MRN: [id number] DOB: 11/06/1945 Today's Date: 03/18/2013 Time: [id number] PT Time Calculation (min): 16 min  PT Assessment / Plan / Recommendation  History of Present Illness S/p elective Lt TKA    PT Comments   Pt cont's to progress with mobility.  Ambulated ~150' with RW.  Beginning progress to step-through gait pattern with cuing.  Pt safe to d/c home from mobility standpoint when MD feels medically ready.     Follow Up Recommendations  Home health PT;Supervision/Assistance - 24 hour     Does the patient have the potential to tolerate intense rehabilitation     Barriers to Discharge        Equipment Recommendations  None recommended by PT    Recommendations for Other Services    Frequency 7X/week   Progress towards PT Goals Progress towards PT goals: Progressing toward goals  Plan Current plan remains appropriate    Precautions / Restrictions Precautions Precautions: Knee;Fall Restrictions Weight Bearing Restrictions: Yes LLE Weight Bearing: Weight bearing as tolerated   Pertinent Vitals/Pain 5/10 Lt knee.  Repositioned for comfort.      Mobility  Bed Mobility Overal bed mobility: Modified Independent Bed Mobility: Supine to Sit;Sit to Supine Supine to sit: Modified independent (Device/Increase time) Sit to supine: Modified independent (Device/Increase time) General bed mobility comments: min cues for hand placement and sequencing; HOB flat Transfers Overall transfer level: Needs assistance Equipment used: Rolling walker (2 wheeled) Transfers: Sit to/from Stand Sit to Stand: Supervision General transfer comment: Pt demonstrated safe hadn placement Ambulation/Gait Ambulation/Gait assistance: Min guard Ambulation Distance (Feet): 150 Feet Assistive device: Rolling walker (2 wheeled) Gait Pattern/deviations: Step-to pattern;Step-through pattern;Decreased step length - right;Antalgic General Gait  Details: Cues for increased RLE step length for step-through gait pattern.        PT Goals (current goals can now be found in the care plan section) Acute Rehab PT Goals Patient Stated Goal: To go home this afternoon PT Goal Formulation: With patient Time For Goal Achievement: 03/31/13 Potential to Achieve Goals: Good  Visit Information  Last PT Received On: 03/18/13 Assistance Needed: +1 History of Present Illness: S/p elective Lt TKA     Subjective Data  Patient Stated Goal: To go home this afternoon   Cognition  Cognition Arousal/Alertness: Awake/alert Behavior During Therapy: WFL for tasks assessed/performed Overall Cognitive Status: Within Functional Limits for tasks assessed    Balance  Balance Overall balance assessment: No apparent balance deficits (not formally assessed)  End of Session PT - End of Session Activity Tolerance: Patient tolerated treatment well Patient left: in bed;with call bell/phone within reach;with family/visitor present Nurse Communication: Mobility status CPM Left Knee CPM Left Knee: Off   GP     Lara Mulch,  Lynn 03/18/2013, 2:51 PM  Verdell Face , PTA [phone] 03/18/2013

## 2013-03-18 NOTE — Progress Notes (Signed)
Orthopedic Tech Progress Note Patient Details:  [name] 11/28/1945 [id number] Patient placed in CPM for 1500 rounds CPM Left Knee CPM Left Knee: On Left Knee Flexion (Degrees): 70 Left Knee Extension (Degrees): 0   Asia R Thompson 03/18/2013, 3:11 PM

## 2013-03-18 NOTE — Progress Notes (Signed)
Pt discharged home. D/c instructions given. No questions verbalized. Vitals stable. 

## 2013-03-18 NOTE — Progress Notes (Signed)
Physical Therapy Treatment Patient Details Name: Madison Frank MRN: [id number] DOB: 02/18/1946 Today's Date: 03/18/2013 Time: [id number] PT Time Calculation (min): 23 min  PT Assessment / Plan / Recommendation  History of Present Illness s/p elective Lt TKA    PT Comments   Pt progressing well with mobility/PT goals.  Increased ambulation distance & required decreased physical (A) for all tasks.  Cont with current POC to maximize mobility prior to d/c home.     Follow Up Recommendations  Home health PT;Supervision/Assistance - 24 hour     Does the patient have the potential to tolerate intense rehabilitation     Barriers to Discharge        Equipment Recommendations  None recommended by PT    Recommendations for Other Services OT consult  Frequency 7X/week   Progress towards PT Goals Progress towards PT goals: Progressing toward goals  Plan Current plan remains appropriate    Precautions / Restrictions Precautions Precautions: Knee;Fall Restrictions LLE Weight Bearing: Weight bearing as tolerated   Pertinent Vitals/Pain 5/10 Lt knee.  Repositioned for comfort.      Mobility  Bed Mobility Overal bed mobility: Modified Independent Bed Mobility: Supine to Sit Transfers Overall transfer level: Needs assistance Equipment used: Rolling walker (2 wheeled) Transfers: Sit to/from Stand Sit to Stand: Min guard General transfer comment: cues for hand placement & controlled descent Ambulation/Gait Ambulation/Gait assistance: Min guard Ambulation Distance (Feet): 60 Feet Assistive device: Rolling walker (2 wheeled) Gait Pattern/deviations: Step-to pattern;Decreased weight shift to left;Antalgic General Gait Details: cues for sequencing, increased heel strike LLE, increased WBing LLE    Exercises Total Joint Exercises Ankle Circles/Pumps: AROM;Both;10 reps Quad Sets: AROM;Both;10 reps Heel Slides: AAROM;Strengthening;Left;10 reps Hip ABduction/ADduction:  AAROM;Strengthening;Left;10 reps Straight Leg Raises: AAROM;Strengthening;Left;10 reps    PT Goals (current goals can now be found in the care plan section) Acute Rehab PT Goals Patient Stated Goal: to go home tomorrow PT Goal Formulation: With patient Time For Goal Achievement: 03/31/13 Potential to Achieve Goals: Good  Visit Information  Last PT Received On: 03/18/13 Assistance Needed: +1 History of Present Illness: s/p elective Lt TKA     Subjective Data  Patient Stated Goal: to go home tomorrow   Cognition  Cognition Arousal/Alertness: Awake/alert Behavior During Therapy: WFL for tasks assessed/performed Overall Cognitive Status: Within Functional Limits for tasks assessed    Balance     End of Session PT - End of Session Activity Tolerance: Patient tolerated treatment well Patient left: in chair;with call bell/phone within reach;with family/visitor present Nurse Communication: Mobility status   GP     Lara Mulch,  Lynn 03/18/2013, 9:27 AM  Verdell Face , PTA [phone] 03/18/2013

## 2013-03-25 NOTE — Discharge Summary (Signed)
SPORTS MEDICINE & JOINT REPLACEMENT   Georgena Spurling, MD   Altamese Cabal, PA-C [address] [place], [place], [place]  [address]                             [phone]  PATIENT ID: Madison Frank        MRN:  [id number]          DOB/AGE: Dec 07, 1945 / 68 y.o.    DISCHARGE SUMMARY  ADMISSION DATE:    03/17/2013 DISCHARGE DATE:   03/18/2013  ADMISSION DIAGNOSIS: osteoarthritis left knee    DISCHARGE DIAGNOSIS:  osteoarthritis left knee    ADDITIONAL DIAGNOSIS: Active Problems:   S/P total knee arthroplasty  Past Medical History  Diagnosis Date  . Heart murmur     as a child  . Acute bronchitis   . Shortness of breath     with exertion  . On home O2     @ 2L/m via [place]  . Asthma   . Sleep apnea     CPAP;sleep study done several yrs ago  . History of migraine     last one 03/11/13  . Arthritis   . Joint pain   . Joint swelling   . GERD (gastroesophageal reflux disease)     but doesn't take any meds  . Urinary frequency   . Nocturia   . Cataracts, bilateral     immature  . Insomnia     occasionally but doesn' take any meds   . History of shingles   . COPD (chronic obstructive pulmonary disease)     uses Albuterol as needed;emphysema    PROCEDURE: Procedure(s): LEFT TOTAL KNEE ARTHROPLASTY on 03/17/2013  CONSULTS:     HISTORY:  See H&P in chart  HOSPITAL COURSE:  Madison Frank is a 68 y.o. admitted on 03/17/2013 and found to have a diagnosis of osteoarthritis left knee.  After appropriate laboratory studies were obtained  they were taken to the operating room on 03/17/2013 and underwent Procedure(s): LEFT TOTAL KNEE ARTHROPLASTY.   They were given perioperative antibiotics:  Anti-infectives   Start     Dose/Rate Route Frequency Ordered Stop   03/17/13 1400  ceFAZolin (ANCEF) IVPB 1 g/50 mL premix     1 g 100 mL/hr over 30 Minutes Intravenous Every 6 hours 03/17/13 1247 03/17/13 2130   03/17/13 0720  ceFAZolin (ANCEF) 2-3 GM-% IVPB SOLR    Comments:  Hoover Browns   : cabinet override      03/17/13 0720 03/17/13 1929   03/17/13 0600  ceFAZolin (ANCEF) IVPB 2 g/50 mL premix     2 g 100 mL/hr over 30 Minutes Intravenous On call to O.R. 03/16/13 1348 03/17/13 0745    .  Tolerated the procedure well.  Placed with a foley intraoperatively.  Given Ofirmev at induction and for 48 hours.    POD# 1: Vital signs were stable.  Patient denied Chest pain, shortness of breath, or calf pain.  Patient was started on Lovenox 30 mg subcutaneously twice daily at 8am.  Consults to PT, OT, and care management were made.  The patient was weight bearing as tolerated.  CPM was placed on the operative leg 0-90 degrees for 6-8 hours a day.  Incentive spirometry was taught.  Dressing was changed.  Marcaine pump and hemovac were discontinued.      POD #2, Continued  PT for ambulation and exercise program.  IV saline locked.  O2 discontinued.    The remainder of the hospital course was dedicated to ambulation and strengthening.   The patient was discharged on 1 day post op in  Good condition.  Blood products given:none  DIAGNOSTIC STUDIES: Recent vital signs: No data found.      Recent laboratory studies: No results found for this basename: WBC, HGB, HCT, PLT,  in the last 168 hours No results found for this basename: NA, K, CL, CO2, BUN, CREATININE, GLUCOSE, CALCIUM,  in the last 168 hours Lab Results  Component Value Date   INR 1.04 03/13/2013     Recent Radiographic Studies :  Dg Chest 2 View  03/13/2013   CLINICAL DATA:  Preop for left total knee replacement. , history of COPD and sleep apnea  EXAM: CHEST  2 VIEW  COMPARISON:  PA and lateral chest x-ray dated September 01, 2009  FINDINGS: The lungs are hyperinflated with hemidiaphragm flattening. There is no focal infiltrate. The cardiac silhouette is normal in size. The mediastinum is normal in width. The pulmonary vascularity is not engorged. There is no pleural effusion or pneumothorax. The observed portions of  the bony thorax exhibit no acute abnormality.  IMPRESSION: The findings are consistent with COPD. There is no evidence of pneumonia nor CHF. Stable minimal blunting of the posterior costophrenic angles is likely related to the hyperinflation.   Electronically Signed   By: David  [place]   On: 03/13/2013 16:26    DISCHARGE INSTRUCTIONS: Discharge Orders   Future Orders Complete By Expires   Call MD / Call 911  As directed    Comments:     If you experience chest pain or shortness of breath, CALL 911 and be transported to the hospital emergency room.  If you develope a fever above 101 F, pus (white drainage) or increased drainage or redness at the wound, or calf pain, call your surgeon's office.   Change dressing  As directed    Comments:     Change dressing on Wednesday, then change the dressing daily with sterile 4 x 4 inch gauze dressing and apply TED hose.   Constipation Prevention  As directed    Comments:     Drink plenty of fluids.  Prune juice may be helpful.  You may use a stool softener, such as Colace (over the counter) 100 mg twice a day.  Use MiraLax (over the counter) for constipation as needed.   CPM  As directed    Comments:     Continuous passive motion machine (CPM):      Use the CPM from 0 to 90 for 6-8 hours per day.      You may increase by 10 per day.  You may break it up into 2 or 3 sessions per day.      Use CPM for 2 weeks or until you are told to stop.   Diet - low sodium heart healthy  As directed    Do not put a pillow under the knee. Place it under the heel.  As directed    Driving restrictions  As directed    Comments:     No driving for 6 weeks   Increase activity slowly as tolerated  As directed    Lifting restrictions  As directed    Comments:     No lifting for 6 weeks   TED hose  As directed    Comments:     Use stockings (TED hose) for 3 weeks on both leg(s).  DISCHARGE MEDICATIONS:     Medication List    STOP taking these medications        traMADol 50 MG tablet  Commonly known as:  ULTRAM      TAKE these medications       albuterol 108 (90 BASE) MCG/ACT inhaler  Commonly known as:  PROVENTIL HFA;VENTOLIN HFA  Inhale into the lungs every 6 (six) hours as needed for wheezing or shortness of breath.     aspirin 325 MG tablet  Take 325 mg by mouth every 6 (six) hours as needed for mild pain.     enoxaparin 40 MG/0.4ML injection  Commonly known as:  LOVENOX  Inject 0.4 mLs (40 mg total) into the skin daily.     ibuprofen 600 MG tablet  Commonly known as:  ADVIL,MOTRIN  Take 1 tablet (600 mg total) by mouth 3 (three) times daily.     oxyCODONE 5 MG immediate release tablet  Commonly known as:  Oxy IR/ROXICODONE  Take 1-2 tablets (5-10 mg total) by mouth every 3 (three) hours as needed for breakthrough pain.        FOLLOW UP VISIT:       Follow-up Information   Follow up with Raymon MuttonLUCEY,STEPHEN D, MD. Call on 04/01/2013.   Specialty:  Orthopedic Surgery   Contact information:   [address] [place] [id number] [phone]       DISPOSITION: HOME  CONDITION:  Good   , 03/25/2013, 7:26 PM

## 2013-06-09 ENCOUNTER — Emergency Department: Payer: Self-pay | Admitting: Emergency Medicine

## 2014-05-22 DIAGNOSIS — E785 Hyperlipidemia, unspecified: Secondary | ICD-10-CM | POA: Diagnosis not present

## 2014-05-22 DIAGNOSIS — M1990 Unspecified osteoarthritis, unspecified site: Secondary | ICD-10-CM | POA: Diagnosis not present

## 2014-05-22 DIAGNOSIS — J449 Chronic obstructive pulmonary disease, unspecified: Secondary | ICD-10-CM | POA: Diagnosis not present

## 2014-05-22 DIAGNOSIS — I2510 Atherosclerotic heart disease of native coronary artery without angina pectoris: Secondary | ICD-10-CM | POA: Diagnosis not present

## 2014-06-03 DIAGNOSIS — J449 Chronic obstructive pulmonary disease, unspecified: Secondary | ICD-10-CM | POA: Diagnosis not present

## 2014-07-03 DIAGNOSIS — J449 Chronic obstructive pulmonary disease, unspecified: Secondary | ICD-10-CM | POA: Diagnosis not present

## 2014-08-03 DIAGNOSIS — J449 Chronic obstructive pulmonary disease, unspecified: Secondary | ICD-10-CM | POA: Diagnosis not present

## 2015-02-25 IMAGING — CR DG CHEST 2V
2 series · 2 of 2 positions shown · non-contrast
Comparison: PA and lateral chest x-ray dated September 01, 2009

CLINICAL DATA: Preop for left total knee replacement. , history of
COPD and sleep apnea

EXAM:
CHEST  2 VIEW

[w chest pa]
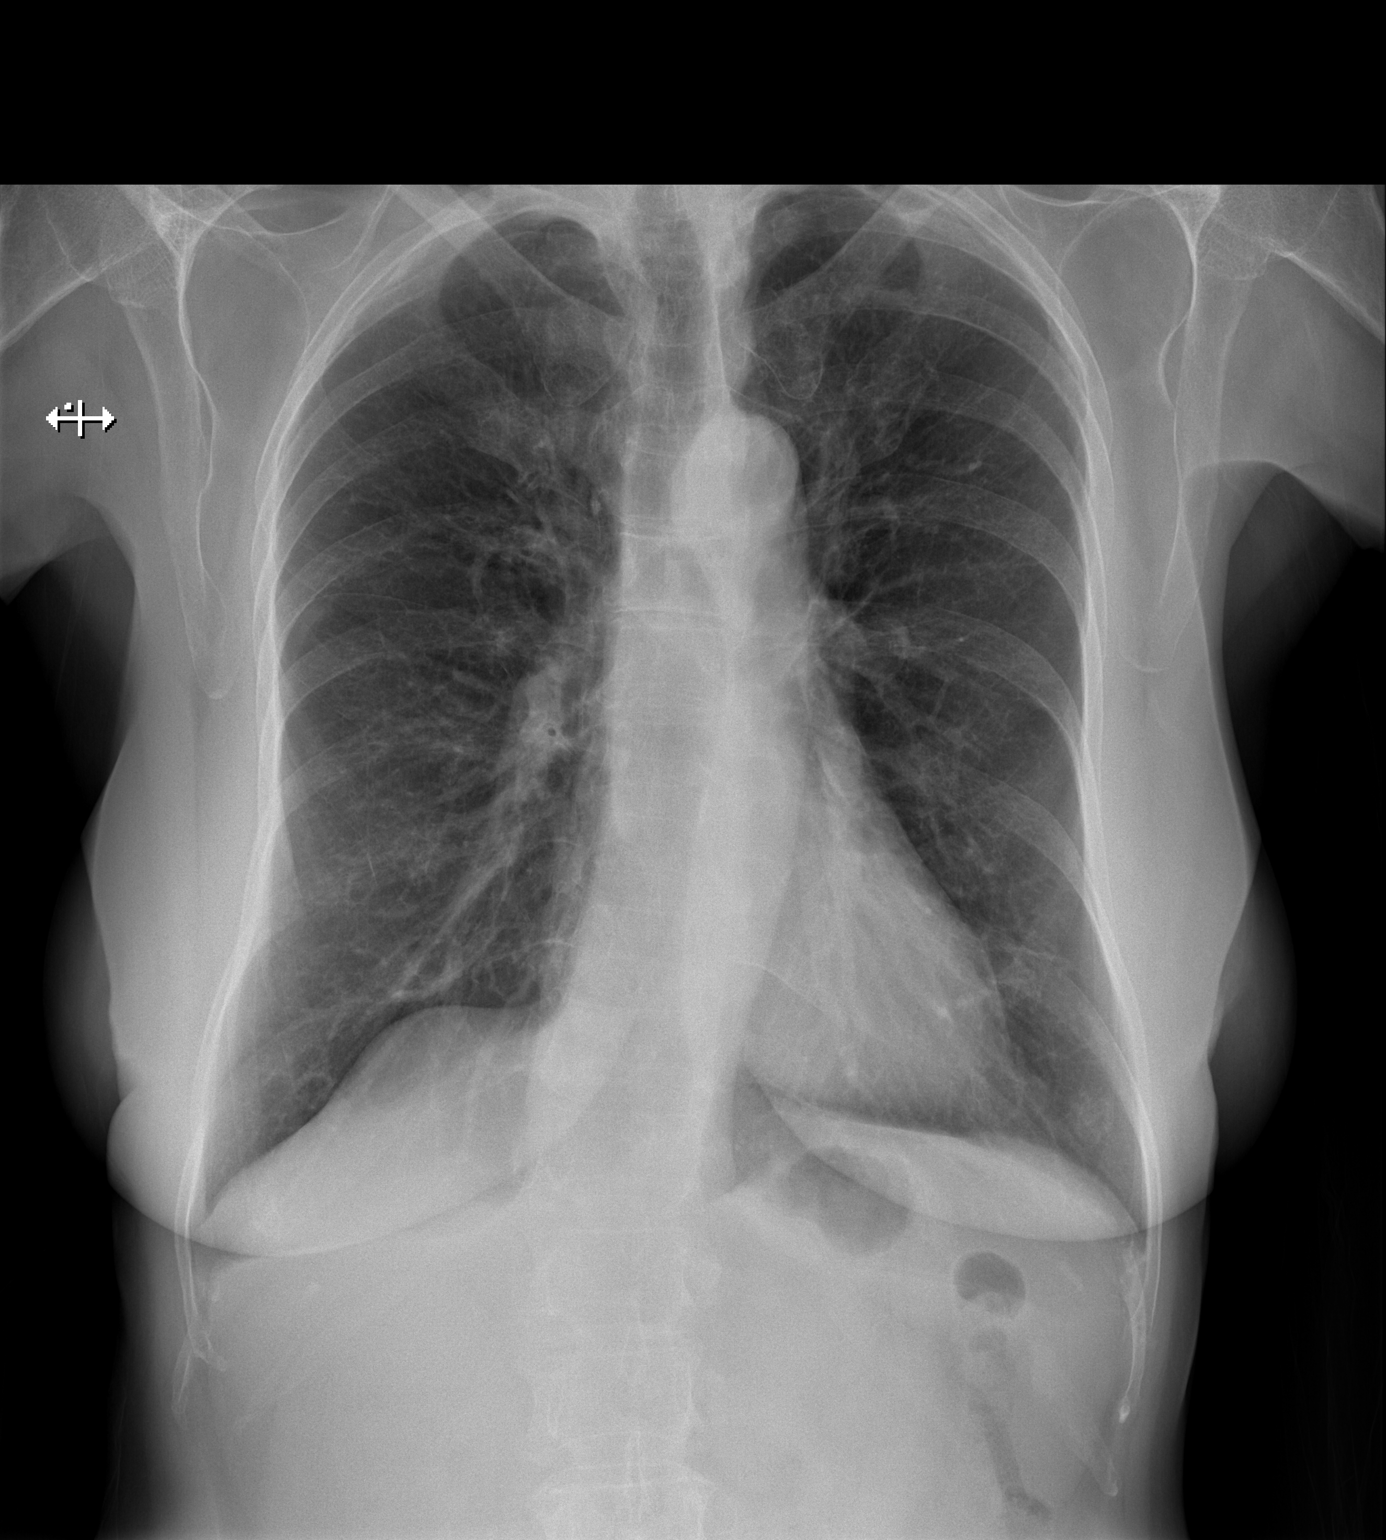

[w chest lat]
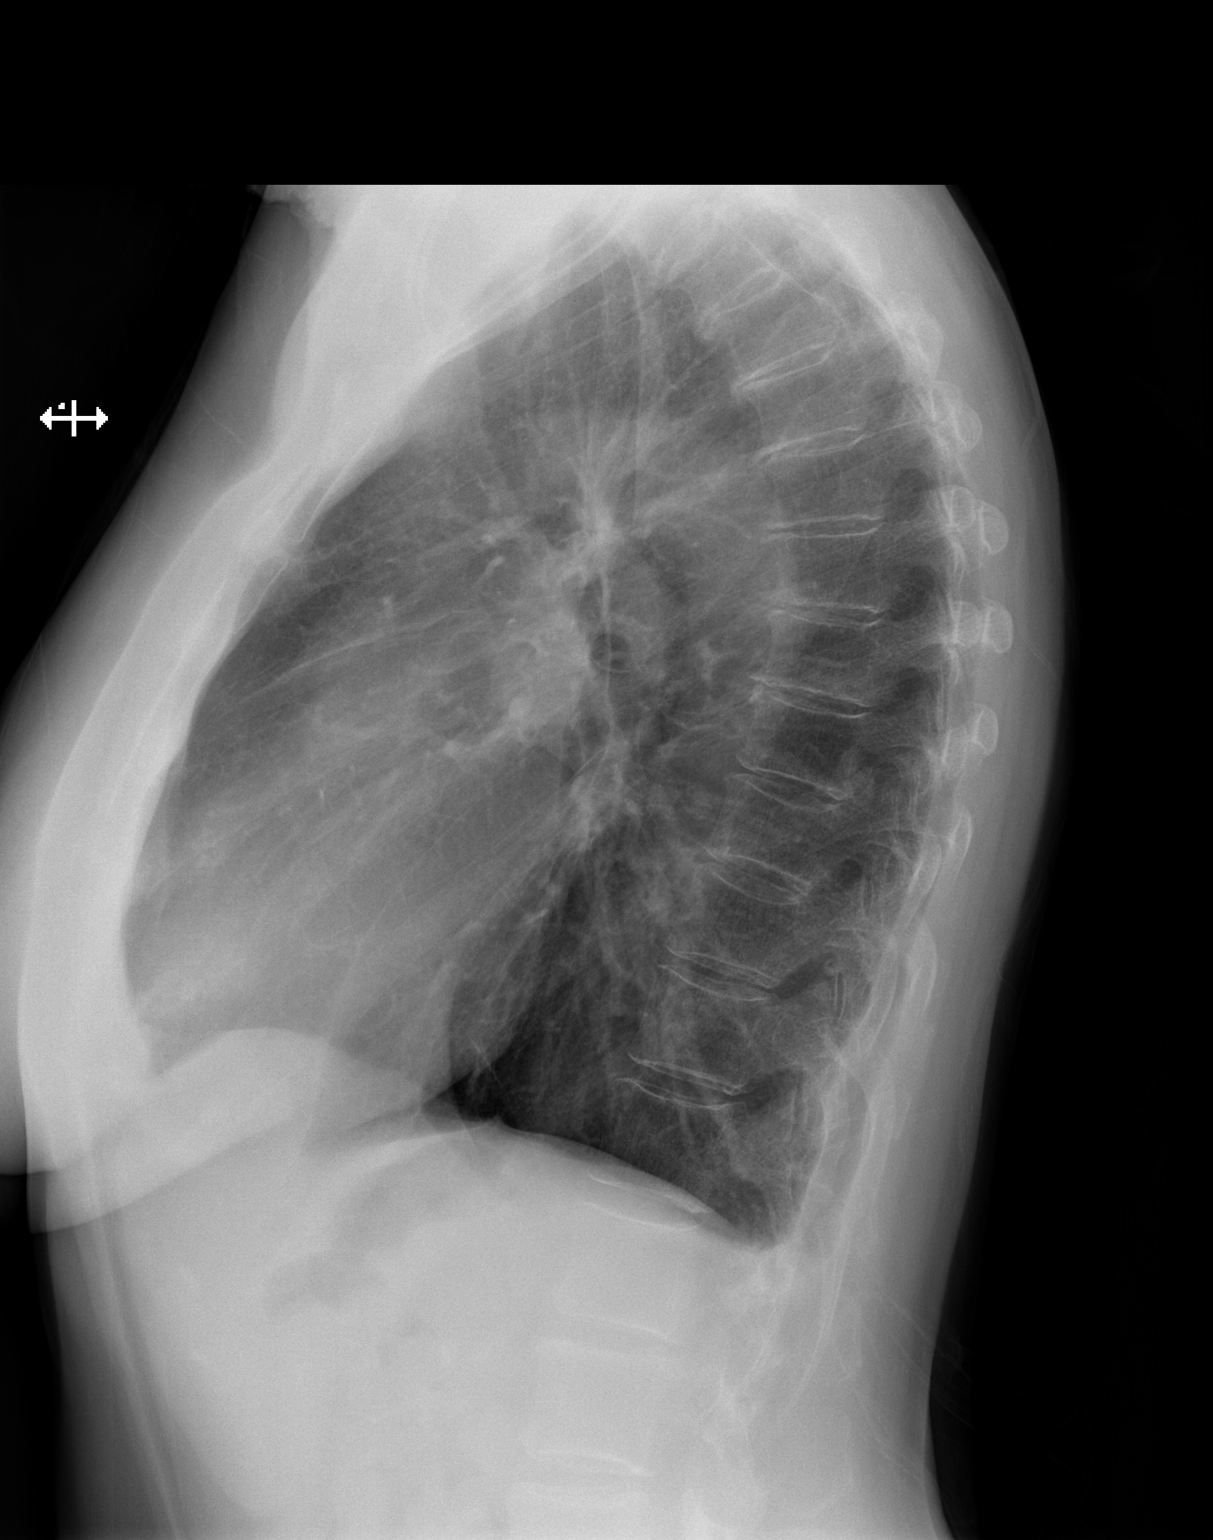

[2 of 2 positions shown; findings below may reference images not displayed]

FINDINGS: The lungs are hyperinflated with hemidiaphragm flattening. There is
no focal infiltrate. The cardiac silhouette is normal in size. The
mediastinum is normal in width. The pulmonary vascularity is not
engorged. There is no pleural effusion or pneumothorax. The observed
portions of the bony thorax exhibit no acute abnormality.
IMPRESSION: The findings are consistent with COPD. There is no evidence of
pneumonia nor CHF. Stable minimal blunting of the posterior
costophrenic angles is likely related to the hyperinflation.

## 2015-08-26 ENCOUNTER — Ambulatory Visit
Admission: EM | Admit: 2015-08-26 | Discharge: 2015-08-26 | Disposition: A | Discharge status: Home / Self Care | Payer: Medicaid Other | Attending: Family Medicine | Admitting: Family Medicine

## 2015-08-26 ENCOUNTER — Encounter: Payer: Self-pay | Admitting: Emergency Medicine

## 2015-08-26 DIAGNOSIS — L989 Disorder of the skin and subcutaneous tissue, unspecified: Secondary | ICD-10-CM | POA: Diagnosis not present

## 2015-08-26 MED ORDER — MUPIROCIN 2 % EX OINT: TOPICAL_OINTMENT | CUTANEOUS | Status: DC

## 2015-08-26 NOTE — ED Notes (Signed)
Patient reports possible tick bite in between her breast.  Patient states that she noticed it this morning.

## 2015-08-26 NOTE — Discharge Instructions (Signed)
Use medication as prescribed. Keep clean. Avoid rubbing or scratching. Monitor closely.   Follow up with your primary care physician this week. Return to Urgent care for increased redness, swelling, drainage, fevers, new or worsening concerns.

## 2015-08-26 NOTE — ED Provider Notes (Signed)
[facility]  ____________________________________________  Time seen: Approximately 11:50 AM  I have reviewed the triage vital signs and the nursing notes.   HISTORY  Chief Complaint Insect Bite   HPI Cathleen FearsVickie L Mchale is a 70 y.o. female presents for evaluation of black area with mild redness around in between her breasts. States noticed the area this morning. Patient expresses that she is concerned this may be a tick that is attached. Patient reports that she is frequently outside but denies known tick bites or seeing any ticks. Patient reports and she looked at the area and her husband to the area she could not tell if it was definitely a tick or not. States her husband did not see a tick. Reports she couldn't recall if she had a mole in that same location. Patient states that the area is itchy and she has been intermittently scratching the area. Patient states that she is feeling well otherwise.   Denies any other skin changes, rashes, itching, insect or tick bite or attachments, fevers, neck pain, back pain, extremity pain or extremity swelling. Reports continues to eat and drink well. Denies chest pain, shortness of breath, abdominal pain, dysuria.  ZOX:WRUEAVWPCP:[place]    Past Medical History  Diagnosis Date  . Heart murmur     as a child  . Acute bronchitis   . Shortness of breath     with exertion  . On home O2     @ 2L/m via [place]  . Asthma   . Sleep apnea     CPAP;sleep study done several yrs ago  . History of migraine     last one 03/11/13  . Arthritis   . Joint pain   . Joint swelling   . GERD (gastroesophageal reflux disease)     but doesn't take any meds  . Urinary frequency   . Nocturia   . Cataracts, bilateral     immature  . Insomnia     occasionally but doesn' take any meds   . History of shingles   . COPD (chronic obstructive pulmonary disease) (HCC)     uses Albuterol as needed;emphysema    Patient Active Problem List   Diagnosis Date Noted  . S/P  total knee arthroplasty 03/17/2013    Past Surgical History  Procedure Laterality Date  . Abdominal hysterectomy    . Total knee arthroplasty Left 03/17/2013    Procedure: LEFT TOTAL KNEE ARTHROPLASTY;  Surgeon: Dannielle HuhSteve Lucey, MD;  Location: [facility] OR;  Service: Orthopedics;  Laterality: Left;    Current Outpatient Rx  Name  Route  Sig  Dispense  Refill  . albuterol (PROVENTIL HFA;VENTOLIN HFA) 108 (90 BASE) MCG/ACT inhaler   Inhalation   Inhale into the lungs every 6 (six) hours as needed for wheezing or shortness of breath.         .           .           .           .             Allergies Review of patient's allergies indicates no known allergies.  History reviewed. No pertinent family history.  Social History Social History  Substance Use Topics  . Smoking status: Former Games developermoker  . Smokeless tobacco: None  . Alcohol Use: No    Review of Systems Constitutional: No fever/chills Eyes: No visual changes. ENT: No sore throat. Cardiovascular: Denies chest pain. Respiratory: Denies shortness  of breath. Gastrointestinal: No abdominal pain.  No nausea, no vomiting.  No diarrhea.  No constipation. Genitourinary: Negative for dysuria. Musculoskeletal: Negative for back pain. Skin: Negative for rash. As above.  Neurological: Negative for headaches, focal weakness or numbness.  10-point ROS otherwise negative.  ____________________________________________   PHYSICAL EXAM:  VITAL SIGNS: ED Triage Vitals  Enc Vitals Group     BP 08/26/15 1127 163/79 mmHg     Pulse Rate 08/26/15 1127 69     Resp 08/26/15 1127 16     Temp 08/26/15 1127 97.1 F (36.2 C)     Temp Source 08/26/15 1127 Tympanic     SpO2 08/26/15 1127 96 %     Weight 08/26/15 1127 118 lb (53.524 kg)     Height 08/26/15 1127  (1.626 m)     Head Cir --      Peak Flow --      Pain Score 08/26/15 1130 3     Pain Loc --      Pain Edu? --      Excl. in GC? --     Constitutional: Alert and oriented.  Well appearing and in no acute distress. Eyes: Conjunctivae are normal. PERRL. EOMI. Head: Atraumatic. Nontender, no erythema.  Ears: Normal external appearance bilaterally.   Nose: No congestion/rhinnorhea.  Mouth/Throat: Mucous membranes are moist.  Oropharynx non-erythematous. Neck: No stridor.  No cervical spine tenderness to palpation. Hematological/Lymphatic/Immunilogical: No cervical lymphadenopathy. Cardiovascular: Normal rate, regular rhythm. Grossly normal heart sounds.  Good peripheral circulation. Respiratory: Normal respiratory effort.  No retractions. Lungs CTAB. No wheezes, rales or rhonchi. Gastrointestinal: Soft and nontender. No distention.  Musculoskeletal: No lower or upper extremity tenderness nor edema. No cervical, thoracic or lumbar tenderness to palpation.  Neurologic:  Normal speech and language. No gross focal neurologic deficits are appreciated. No gait instability. Skin:  Skin is warm, dry and intact. No rash noted. No insects or foreign bodies noted.  Except: between breasts on anterior mid chest wall, <0.5 cm circular black round lesion, upon further inspection lesion consistency appears consistent with nevus, inspected with light, no foreign body or insect noted, nontender, minimal to mild immediately surrounding erythema noted, no further surrounding erythema, no fluctuance, no drainage, no induration.  Psychiatric: Mood and affect are normal. Speech and behavior are normal.  ____________________________________________   LABS (all labs ordered are listed, but only abnormal results are displayed)  Labs Reviewed - No data to display  RADIOLOGY  No results found. ____________________________________________   INITIAL IMPRESSION / ASSESSMENT AND PLAN / ED COURSE  Pertinent labs & imaging results that were available during my care of the patient were reviewed by me and considered in my medical decision making (see chart for details).   Well-appearing  patient. No acute distress. Presents for the complaint of evaluation of skin area to anterior chest wall between breasts. Patient reports is concerned that area is an attached tick and she says she is unsure how long it may have been attached, states her husband checked at home and did not see a tick. Skin lesion noted and appearance consistent with nevus, no foreign body or insect noted, area with very mild surrounding erythema suspect local irritation. Patient states that she may have scratched the area accidentally and further aggravated when taking her bra on and off. Will treat with topical Bactroban. Encouraged not to scratch. Encourage closely monitoring at home. Follow with PCP  With this week for follow up and follow up with dermatology as  needed.  Discussed follow up with Primary care physician this week. Discussed follow up and return parameters including no resolution or any worsening concerns. Patient verbalized understanding and agreed to plan.   ____________________________________________   FINAL CLINICAL IMPRESSION(S) / ED DIAGNOSES  Final diagnoses:  Skin lesion     Discharge Medication List as of 08/26/2015 11:55 AM    START taking these medications   Details  mupirocin ointment (BACTROBAN) 2 % Apply three times a day for 5 days., Normal        Note: This dictation was prepared with Dragon dictation along with smaller phrase technology. Any transcriptional errors that result from this process are unintentional.       Renford DillsLindsey , NP 08/26/15 1229

## 2015-10-26 ENCOUNTER — Other Ambulatory Visit: Payer: Self-pay | Admitting: Orthopedic Surgery

## 2015-10-26 DIAGNOSIS — M1711 Unilateral primary osteoarthritis, right knee: Secondary | ICD-10-CM

## 2015-11-09 ENCOUNTER — Ambulatory Visit
Admission: RE | Admit: 2015-11-09 | Discharge: 2015-11-09 | Disposition: A | Discharge status: Home / Self Care | Payer: Medicare Other | Source: Ambulatory Visit | Attending: Orthopedic Surgery | Admitting: Orthopedic Surgery

## 2015-11-09 DIAGNOSIS — M1711 Unilateral primary osteoarthritis, right knee: Secondary | ICD-10-CM | POA: Diagnosis not present

## 2015-11-09 DIAGNOSIS — R600 Localized edema: Secondary | ICD-10-CM | POA: Insufficient documentation

## 2015-11-09 DIAGNOSIS — M2341 Loose body in knee, right knee: Secondary | ICD-10-CM | POA: Insufficient documentation

## 2022-09-04 ENCOUNTER — Emergency Department: Payer: 59

## 2022-09-04 ENCOUNTER — Emergency Department
Admission: EM | Admit: 2022-09-04 | Discharge: 2022-09-04 | Disposition: A | Discharge status: Home / Self Care | Payer: 59 | Attending: Emergency Medicine | Admitting: Emergency Medicine

## 2022-09-04 DIAGNOSIS — J9611 Chronic respiratory failure with hypoxia: Secondary | ICD-10-CM | POA: Diagnosis not present

## 2022-09-04 DIAGNOSIS — Z1152 Encounter for screening for COVID-19: Secondary | ICD-10-CM | POA: Insufficient documentation

## 2022-09-04 DIAGNOSIS — R0602 Shortness of breath: Secondary | ICD-10-CM | POA: Diagnosis present

## 2022-09-04 DIAGNOSIS — J441 Chronic obstructive pulmonary disease with (acute) exacerbation: Secondary | ICD-10-CM | POA: Insufficient documentation

## 2022-09-04 LAB — BASIC METABOLIC PANEL
Anion gap: 7 (ref 5–15)
BUN: 27 mg/dL — ABNORMAL HIGH (ref 8–23)
CO2: 26 mmol/L (ref 22–32)
Calcium: 8.9 mg/dL (ref 8.9–10.3)
Chloride: 110 mmol/L (ref 98–111)
Creatinine, Ser: 0.66 mg/dL (ref 0.44–1.00)
GFR, Estimated: >60 mL/min (ref >60)
Glucose, Bld: 110 mg/dL — ABNORMAL HIGH (ref 70–99)
Potassium: 4.1 mmol/L (ref 3.5–5.1)
Sodium: 143 mmol/L (ref 135–145)

## 2022-09-04 LAB — CBC WITH DIFFERENTIAL/PLATELET
Abs Immature Granulocytes: 0.01 10*3/uL (ref 0.00–0.07)
Basophils Absolute: 0 10*3/uL (ref 0.0–0.1)
Basophils Relative: 1 %
Eosinophils Absolute: 0.2 10*3/uL (ref 0.0–0.5)
Eosinophils Relative: 4 %
HCT: 42.4 % (ref 36.0–46.0)
Hemoglobin: 13.6 g/dL (ref 12.0–15.0)
Immature Granulocytes: 0 %
Lymphocytes Relative: 21 %
Lymphs Abs: 1.2 10*3/uL (ref 0.7–4.0)
MCH: 32.6 pg (ref 26.0–34.0)
MCHC: 32.1 g/dL (ref 30.0–36.0)
MCV: 101.7 fL — ABNORMAL HIGH (ref 80.0–100.0)
Monocytes Absolute: 0.5 10*3/uL (ref 0.1–1.0)
Monocytes Relative: 10 %
Neutro Abs: 3.6 10*3/uL (ref 1.7–7.7)
Neutrophils Relative %: 64 %
Platelets: 199 10*3/uL (ref 150–400)
RBC: 4.17 MIL/uL (ref 3.87–5.11)
RDW: 13.6 % (ref 11.5–15.5)
WBC: 5.6 10*3/uL (ref 4.0–10.5)
nRBC: 0 % (ref 0.0–0.2)

## 2022-09-04 LAB — SARS CORONAVIRUS 2 BY RT PCR: SARS Coronavirus 2 by RT PCR: NEGATIVE

## 2022-09-04 MED ORDER — METHYLPREDNISOLONE SODIUM SUCC 125 MG IJ SOLR
125.0000 mg | Freq: Once | INTRAMUSCULAR | Status: AC
  Administered 2022-09-04: 125 mg via INTRAVENOUS
  Filled 2022-09-04: qty 2

## 2022-09-04 MED ORDER — MAGNESIUM SULFATE 2 GM/50ML IV SOLN
2.0000 g | INTRAVENOUS | Status: AC
  Administered 2022-09-04: 2 g via INTRAVENOUS
  Filled 2022-09-04: qty 50

## 2022-09-04 MED ORDER — GUAIFENESIN ER 600 MG PO TB12: 600.0000 mg | ORAL_TABLET | Freq: Two times a day (BID) | ORAL | 0 refills | Status: AC

## 2022-09-04 MED ORDER — DOXYCYCLINE HYCLATE 100 MG PO CAPS: 100.0000 mg | ORAL_CAPSULE | Freq: Two times a day (BID) | ORAL | 0 refills | Status: AC

## 2022-09-04 MED ORDER — ALBUTEROL SULFATE (2.5 MG/3ML) 0.083% IN NEBU
5.0000 mg | INHALATION_SOLUTION | Freq: Once | RESPIRATORY_TRACT | Status: AC
  Administered 2022-09-04: 5 mg via RESPIRATORY_TRACT
  Filled 2022-09-04: qty 6

## 2022-09-04 MED ORDER — IPRATROPIUM-ALBUTEROL 0.5-2.5 (3) MG/3ML IN SOLN
3.0000 mL | Freq: Once | RESPIRATORY_TRACT | Status: AC
  Administered 2022-09-04: 3 mL via RESPIRATORY_TRACT
  Filled 2022-09-04: qty 3

## 2022-09-04 MED ORDER — PREDNISONE 20 MG PO TABS: 40.0000 mg | ORAL_TABLET | Freq: Every day | ORAL | 0 refills | Status: AC

## 2022-09-04 NOTE — ED Triage Notes (Addendum)
Pt arrived POV with husband for COPD exacerbation, was seen at another hospital for same a few weeks ago, pt reports SOB started yesterday, has used breathing treatments at home, wears home oxygen but does not have a traveling tank, pt with pursed lip breathing, RR 34, Sats 82% on RA. Lungs clear and diminished throughout.   Pt applied to supplemental 02 in triage, placed pt on her usual 3L [place]. Sats increased to 91% on 3L [place].

## 2022-09-04 NOTE — ED Provider Notes (Signed)
[facility] Provider Note    Event Date/Time   First MD Initiated Contact with Patient 09/04/22 0050     (approximate)   History   Chief Complaint: COPD Exacerbation   HPI  Madison Frank is a 77 y.o. female with a history of COPD on 3 L nasal cannula at home who comes ED complaining of increased shortness of breath.  Denies cough, denies fever.  No chest pain.  No vomiting or diarrhea.     Physical Exam   Triage Vital Signs: ED Triage Vitals  Encounter Vitals Group     BP 09/04/22 0034 (!) 121/59     Systolic BP Percentile --      Diastolic BP Percentile --      Pulse Rate 09/04/22 0034 87     Resp 09/04/22 0034 (!) 34     Temp 09/04/22 0034 98.4 F (36.9 C)     Temp Source 09/04/22 0034 Oral     SpO2 09/04/22 0034 (!) 82 %     Weight 09/04/22 0040 127 lb (57.6 kg)     Height 09/04/22 0040 5\' 4"  (1.626 m)     Head Circumference --      Peak Flow --      Pain Score 09/04/22 0040 0     Pain Loc --      Pain Education --      Exclude from Growth Chart --     Most recent vital signs: Vitals:   09/04/22 0330 09/04/22 0400  BP: 138/63 124/65  Pulse: 88 87  Resp: (!) 32 (!) 31  Temp:    SpO2: 99% 98%    General: Awake, no distress.  CV:  Good peripheral perfusion.  Regular rate and rhythm Resp:  Normal effort.  Prolonged expiratory phase and diffuse expiratory wheezing.  No focal crackles Abd:  No distention.  Soft nontender Other:  No lower extremity edema.   ED Results / Procedures / Treatments   Labs (all labs ordered are listed, but only abnormal results are displayed) Labs Reviewed  BASIC METABOLIC PANEL - Abnormal; Notable for the following components:      Result Value   Glucose, Bld 110 (*)    BUN 27 (*)    All other components within normal limits  CBC WITH DIFFERENTIAL/PLATELET - Abnormal; Notable for the following components:   MCV 101.7 (*)    All other components within normal limits  SARS CORONAVIRUS 2 BY RT  PCR     EKG    RADIOLOGY Chest x-ray interpreted by me, appears unremarkable.  Radiology report reviewed   PROCEDURES:  Procedures   MEDICATIONS ORDERED IN ED: Medications  ipratropium-albuterol (DUONEB) 0.5-2.5 (3) MG/3ML nebulizer solution 3 mL (3 mLs Nebulization Given 09/04/22 0150)  albuterol (PROVENTIL) (2.5 MG/3ML) 0.083% nebulizer solution 5 mg (5 mg Nebulization Given 09/04/22 0217)  magnesium sulfate IVPB 2 g 50 mL (0 g Intravenous Stopped 09/04/22 0408)  methylPREDNISolone sodium succinate (SOLU-MEDROL) 125 mg/2 mL injection 125 mg (125 mg Intravenous Given 09/04/22 0154)     IMPRESSION / MDM / ASSESSMENT AND PLAN / ED COURSE  I reviewed the triage vital signs and the nursing notes.  DDx: COPD exacerbation, pneumothorax, pneumonia, pleural effusion, pulmonary edema, COVID, AKI, anemia, electrolyte abnormality  Patient's presentation is most consistent with acute presentation with potential threat to life or bodily function.  Patient presents with increased shortness of breath with clinical suspicion for COPD exacerbation.  Patient given Solu-Medrol, magnesium bolus,  bronchodilators, and on reassessment her oxygenation is in the high 90s on her chronic 3 L nasal cannula and she is feeling much better.  Has only slight end expiratory wheezing remaining.  Expiratory phase is normalized.  Comfortable with discharge home.  Will continue on prednisone and doxycycline course at home.  Follow-up with primary care, return precautions discussed.       FINAL CLINICAL IMPRESSION(S) / ED DIAGNOSES   Final diagnoses:  COPD with acute exacerbation (HCC)  Chronic respiratory failure with hypoxia (HCC)     Rx / DC Orders   ED Discharge Orders          Ordered    predniSONE (DELTASONE) 20 MG tablet  Daily with breakfast        09/04/22 0404    guaiFENesin (MUCINEX) 600 MG 12 hr tablet  2 times daily        09/04/22 0404    doxycycline (VIBRAMYCIN) 100 MG capsule  2 times  daily        09/04/22 0404             Note:  This document was prepared using Dragon voice recognition software and may include unintentional dictation errors.   Sharman Cheek, MD 09/04/22 [id number]

## 2022-10-03 ENCOUNTER — Ambulatory Visit: Payer: 59 | Admitting: Family

## 2022-10-04 ENCOUNTER — Emergency Department: Payer: 59

## 2022-10-04 ENCOUNTER — Inpatient Hospital Stay
Admission: EM | Admit: 2022-10-04 | Discharge: 2022-10-06 | DRG: 190 | Disposition: A | Discharge status: Home / Self Care | Payer: 59 | Attending: Internal Medicine | Admitting: Internal Medicine

## 2022-10-04 ENCOUNTER — Other Ambulatory Visit: Payer: Self-pay

## 2022-10-04 DIAGNOSIS — H269 Unspecified cataract: Secondary | ICD-10-CM | POA: Diagnosis present

## 2022-10-04 DIAGNOSIS — J9622 Acute and chronic respiratory failure with hypercapnia: Secondary | ICD-10-CM | POA: Diagnosis present

## 2022-10-04 DIAGNOSIS — I5181 Takotsubo syndrome: Secondary | ICD-10-CM | POA: Diagnosis present

## 2022-10-04 DIAGNOSIS — Z90710 Acquired absence of both cervix and uterus: Secondary | ICD-10-CM

## 2022-10-04 DIAGNOSIS — I2489 Other forms of acute ischemic heart disease: Secondary | ICD-10-CM | POA: Diagnosis present

## 2022-10-04 DIAGNOSIS — Z87891 Personal history of nicotine dependence: Secondary | ICD-10-CM

## 2022-10-04 DIAGNOSIS — M1990 Unspecified osteoarthritis, unspecified site: Secondary | ICD-10-CM | POA: Diagnosis present

## 2022-10-04 DIAGNOSIS — E785 Hyperlipidemia, unspecified: Secondary | ICD-10-CM | POA: Diagnosis present

## 2022-10-04 DIAGNOSIS — Z1152 Encounter for screening for COVID-19: Secondary | ICD-10-CM | POA: Diagnosis not present

## 2022-10-04 DIAGNOSIS — Z9981 Dependence on supplemental oxygen: Secondary | ICD-10-CM | POA: Diagnosis not present

## 2022-10-04 DIAGNOSIS — I5021 Acute systolic (congestive) heart failure: Secondary | ICD-10-CM | POA: Diagnosis present

## 2022-10-04 DIAGNOSIS — Z91199 Patient's noncompliance with other medical treatment and regimen due to unspecified reason: Secondary | ICD-10-CM

## 2022-10-04 DIAGNOSIS — E876 Hypokalemia: Secondary | ICD-10-CM | POA: Diagnosis present

## 2022-10-04 DIAGNOSIS — Z7982 Long term (current) use of aspirin: Secondary | ICD-10-CM | POA: Diagnosis not present

## 2022-10-04 DIAGNOSIS — R0902 Hypoxemia: Secondary | ICD-10-CM

## 2022-10-04 DIAGNOSIS — R7989 Other specified abnormal findings of blood chemistry: Secondary | ICD-10-CM | POA: Diagnosis not present

## 2022-10-04 DIAGNOSIS — I959 Hypotension, unspecified: Secondary | ICD-10-CM | POA: Diagnosis present

## 2022-10-04 DIAGNOSIS — Z96652 Presence of left artificial knee joint: Secondary | ICD-10-CM | POA: Diagnosis present

## 2022-10-04 DIAGNOSIS — G4730 Sleep apnea, unspecified: Secondary | ICD-10-CM | POA: Diagnosis present

## 2022-10-04 DIAGNOSIS — J441 Chronic obstructive pulmonary disease with (acute) exacerbation: Secondary | ICD-10-CM | POA: Diagnosis present

## 2022-10-04 DIAGNOSIS — Z8619 Personal history of other infectious and parasitic diseases: Secondary | ICD-10-CM | POA: Diagnosis not present

## 2022-10-04 DIAGNOSIS — J9621 Acute and chronic respiratory failure with hypoxia: Secondary | ICD-10-CM

## 2022-10-04 DIAGNOSIS — R946 Abnormal results of thyroid function studies: Secondary | ICD-10-CM | POA: Diagnosis present

## 2022-10-04 DIAGNOSIS — Z79899 Other long term (current) drug therapy: Secondary | ICD-10-CM

## 2022-10-04 DIAGNOSIS — G4700 Insomnia, unspecified: Secondary | ICD-10-CM | POA: Diagnosis present

## 2022-10-04 DIAGNOSIS — Z8669 Personal history of other diseases of the nervous system and sense organs: Secondary | ICD-10-CM

## 2022-10-04 DIAGNOSIS — R000 Tachycardia, unspecified: Secondary | ICD-10-CM | POA: Diagnosis present

## 2022-10-04 DIAGNOSIS — Z8249 Family history of ischemic heart disease and other diseases of the circulatory system: Secondary | ICD-10-CM | POA: Diagnosis not present

## 2022-10-04 LAB — COMPREHENSIVE METABOLIC PANEL
ALT: 11 U/L (ref 0–44)
AST: 16 U/L (ref 15–41)
Albumin: 3.8 g/dL (ref 3.5–5.0)
Alkaline Phosphatase: 43 U/L (ref 38–126)
Anion gap: 9 (ref 5–15)
BUN: 26 mg/dL — ABNORMAL HIGH (ref 8–23)
CO2: 29 mmol/L (ref 22–32)
Calcium: 8.1 mg/dL — ABNORMAL LOW (ref 8.9–10.3)
Chloride: 101 mmol/L (ref 98–111)
Creatinine, Ser: 0.6 mg/dL (ref 0.44–1.00)
GFR, Estimated: >60 mL/min (ref >60)
Glucose, Bld: 135 mg/dL — ABNORMAL HIGH (ref 70–99)
Potassium: 3.2 mmol/L — ABNORMAL LOW (ref 3.5–5.1)
Sodium: 139 mmol/L (ref 135–145)
Total Bilirubin: 0.5 mg/dL (ref 0.3–1.2)
Total Protein: 6.7 g/dL (ref 6.5–8.1)

## 2022-10-04 LAB — CBC WITH DIFFERENTIAL/PLATELET
Abs Immature Granulocytes: 0.05 10*3/uL (ref 0.00–0.07)
Basophils Absolute: 0 10*3/uL (ref 0.0–0.1)
Basophils Relative: 1 %
Eosinophils Absolute: 0.3 10*3/uL (ref 0.0–0.5)
Eosinophils Relative: 3 %
HCT: 44.3 % (ref 36.0–46.0)
Hemoglobin: 14.5 g/dL (ref 12.0–15.0)
Immature Granulocytes: 1 %
Lymphocytes Relative: 34 %
Lymphs Abs: 2.6 10*3/uL (ref 0.7–4.0)
MCH: 32.3 pg (ref 26.0–34.0)
MCHC: 32.7 g/dL (ref 30.0–36.0)
MCV: 98.7 fL (ref 80.0–100.0)
Monocytes Absolute: 0.6 10*3/uL (ref 0.1–1.0)
Monocytes Relative: 8 %
Neutro Abs: 4.1 10*3/uL (ref 1.7–7.7)
Neutrophils Relative %: 53 %
Platelets: 233 10*3/uL (ref 150–400)
RBC: 4.49 MIL/uL (ref 3.87–5.11)
RDW: 12.7 % (ref 11.5–15.5)
WBC: 7.6 10*3/uL (ref 4.0–10.5)
nRBC: 0 % (ref 0.0–0.2)

## 2022-10-04 LAB — BRAIN NATRIURETIC PEPTIDE: B Natriuretic Peptide: 52.3 pg/mL (ref 0.0–100.0)

## 2022-10-04 LAB — TROPONIN I (HIGH SENSITIVITY)
Troponin I (High Sensitivity): 22 ng/L — ABNORMAL HIGH (ref <18)
Troponin I (High Sensitivity): 164 ng/L (ref <18)

## 2022-10-04 LAB — SARS CORONAVIRUS 2 BY RT PCR: SARS Coronavirus 2 by RT PCR: NEGATIVE

## 2022-10-04 MED ORDER — ALBUTEROL SULFATE (2.5 MG/3ML) 0.083% IN NEBU: 2.5000 mg | INHALATION_SOLUTION | RESPIRATORY_TRACT | Status: DC | PRN

## 2022-10-04 MED ORDER — ONDANSETRON HCL 4 MG/2ML IJ SOLN: 4.0000 mg | Freq: Four times a day (QID) | INTRAMUSCULAR | Status: DC | PRN

## 2022-10-04 MED ORDER — ONDANSETRON HCL 4 MG PO TABS: 4.0000 mg | ORAL_TABLET | Freq: Four times a day (QID) | ORAL | Status: DC | PRN

## 2022-10-04 MED ORDER — IPRATROPIUM-ALBUTEROL 0.5-2.5 (3) MG/3ML IN SOLN
3.0000 mL | Freq: Four times a day (QID) | RESPIRATORY_TRACT | Status: DC
  Administered 2022-10-05 – 2022-10-06 (×7): 3 mL via RESPIRATORY_TRACT
  Filled 2022-10-04 (×8): qty 3

## 2022-10-04 MED ORDER — HYDROCODONE-ACETAMINOPHEN 5-325 MG PO TABS: 1.0000 | ORAL_TABLET | ORAL | Status: DC | PRN

## 2022-10-04 MED ORDER — IPRATROPIUM-ALBUTEROL 0.5-2.5 (3) MG/3ML IN SOLN
9.0000 mL | Freq: Once | RESPIRATORY_TRACT | Status: AC
  Administered 2022-10-04: 9 mL via RESPIRATORY_TRACT
  Filled 2022-10-04: qty 9

## 2022-10-04 MED ORDER — ACETAMINOPHEN 325 MG PO TABS: 650.0000 mg | ORAL_TABLET | Freq: Four times a day (QID) | ORAL | Status: DC | PRN

## 2022-10-04 MED ORDER — PREDNISONE 20 MG PO TABS
40.0000 mg | ORAL_TABLET | Freq: Every day | ORAL | Status: DC
  Administered 2022-10-06: 40 mg via ORAL
  Filled 2022-10-04: qty 2

## 2022-10-04 MED ORDER — METHYLPREDNISOLONE SODIUM SUCC 40 MG IJ SOLR
40.0000 mg | Freq: Two times a day (BID) | INTRAMUSCULAR | Status: AC
  Administered 2022-10-04 – 2022-10-05 (×2): 40 mg via INTRAVENOUS
  Filled 2022-10-04 (×2): qty 1

## 2022-10-04 MED ORDER — ACETAMINOPHEN 650 MG RE SUPP: 650.0000 mg | Freq: Four times a day (QID) | RECTAL | Status: DC | PRN

## 2022-10-04 MED ORDER — ENOXAPARIN SODIUM 40 MG/0.4ML IJ SOSY
40.0000 mg | PREFILLED_SYRINGE | INTRAMUSCULAR | Status: DC
  Administered 2022-10-04 – 2022-10-05 (×2): 40 mg via SUBCUTANEOUS
  Filled 2022-10-04 (×2): qty 0.4

## 2022-10-04 NOTE — ED Provider Notes (Signed)
[facility] Provider Note    Event Date/Time   First MD Initiated Contact with Patient 10/04/22 1814     (approximate)   History   Shortness of Breath   HPI  Madison Frank is a 77 y.o. female past medical history significant for COPD on 3 L of home oxygen intermittently but mostly noncompliant, who presents to the emergency department with shortness of breath.  Eating dinner tonight and had a sudden onset of shortness of breath.  When EMS arrived patient was hypoxic with an oxygen saturation of 76%.  Patient was placed on oxygen and continued to have in a low oxygen saturation and was then placed on CPAP.  Given IV Zofran, IV Solu-Medrol, IV magnesium and DuoNeb treatment prior to arrival.  Patient states that she is not having any chest pain.  States that her shortness of breath is significantly improved after arriving to the emergency department.  No recent increase of cough or sputum production.  Denies any tobacco use.     Physical Exam   Triage Vital Signs: ED Triage Vitals  Encounter Vitals Group     BP      Systolic BP Percentile      Diastolic BP Percentile      Pulse      Resp      Temp      Temp src      SpO2      Weight      Height      Head Circumference      Peak Flow      Pain Score      Pain Loc      Pain Education      Exclude from Growth Chart     Most recent vital signs: Vitals:   10/04/22 2025 10/04/22 2315  BP: (!) 114/58 104/63  Pulse: 99 100  Resp: 18 20  Temp: 98.7 F (37.1 C) 98.5 F (36.9 C)  SpO2: 98% 98%    Physical Exam Constitutional:      Appearance: She is well-developed.  HENT:     Head: Atraumatic.  Eyes:     Conjunctiva/sclera: Conjunctivae normal.     Pupils: Pupils are equal, round, and reactive to light.  Cardiovascular:     Rate and Rhythm: Regular rhythm. Tachycardia present.  Pulmonary:     Effort: Tachypnea and respiratory distress present.     Breath sounds: Decreased breath sounds,  wheezing and rhonchi present.  Abdominal:     General: There is no distension.     Palpations: Abdomen is soft.  Musculoskeletal:        General: Normal range of motion.     Cervical back: Normal range of motion.     Right lower leg: No edema.     Left lower leg: No edema.  Skin:    General: Skin is warm.     Capillary Refill: Capillary refill takes less than 2 seconds.  Neurological:     General: No focal deficit present.     Mental Status: She is alert. Mental status is at baseline.     IMPRESSION / MDM / ASSESSMENT AND PLAN / ED COURSE  I reviewed the triage vital signs and the nursing notes.  Differential diagnosis including COPD exacerbation, pneumonia, COVID, flash pulmonary edema, ACS   EKG  I, Corena Herter, the attending physician, personally viewed and interpreted this ECG.  Sinus tachycardia.  Normal intervals.  Nonspecific ST changes.  Significant  underlying artifact Sinus tachycardia while on cardiac telemetry.  RADIOLOGY my interpretation of imaging: Signs of COPD but no signs of pneumonia.  Read as no acute findings.  LABS (all labs ordered are listed, but only abnormal results are displayed) Labs interpreted as -    Labs Reviewed  COMPREHENSIVE METABOLIC PANEL - Abnormal; Notable for the following components:      Result Value   Potassium 3.2 (*)    Glucose, Bld 135 (*)    BUN 26 (*)    Calcium 8.1 (*)    All other components within normal limits  BLOOD GAS, VENOUS - Abnormal; Notable for the following components:   Bicarbonate 31.1 (*)    Acid-Base Excess 3.6 (*)    All other components within normal limits  TROPONIN I (HIGH SENSITIVITY) - Abnormal; Notable for the following components:   Troponin I (High Sensitivity) 22 (*)    All other components within normal limits  TROPONIN I (HIGH SENSITIVITY) - Abnormal; Notable for the following components:   Troponin I (High Sensitivity) 164 (*)    All other components within normal limits  SARS  CORONAVIRUS 2 BY RT PCR  CBC WITH DIFFERENTIAL/PLATELET  BRAIN NATRIURETIC PEPTIDE     MDM    With EMS patient received DuoNeb, IV Solu-Medrol, 2 g of IV magnesium  Patient with continued decreased air movement with wheezing, given DuoNeb treatment.  Patient on CPAP.  No significant hypercarbia on VBG.  No leukocytosis.  COVID testing is negative.  No significantly elevated BNP, not consistent with heart failure exacerbation.  Mildly elevated troponin which is most likely demand ischemia in the setting of her hypoxia.  Consulted hospitalist for admission for COPD exacerbation   PROCEDURES:  Critical Care performed: yes  .Critical Care  Performed by: Corena Herter, MD Authorized by: Corena Herter, MD   Critical care provider statement:    Critical care time (minutes):  30   Critical care time was exclusive of:  Separately billable procedures and treating other patients   Critical care was necessary to treat or prevent imminent or life-threatening deterioration of the following conditions:  Respiratory failure   Critical care was time spent personally by me on the following activities:  Development of treatment plan with patient or surrogate, discussions with consultants, evaluation of patient's response to treatment, examination of patient, ordering and review of laboratory studies, ordering and review of radiographic studies, ordering and performing treatments and interventions, pulse oximetry, re-evaluation of patient's condition and review of old charts   Patient's presentation is most consistent with acute presentation with potential threat to life or bodily function.   MEDICATIONS ORDERED IN ED: Medications  enoxaparin (LOVENOX) injection 40 mg (40 mg Subcutaneous Given 10/04/22 2218)  acetaminophen (TYLENOL) tablet 650 mg (has no administration in time range)    Or  acetaminophen (TYLENOL) suppository 650 mg (has no administration in time range)  ondansetron (ZOFRAN)  tablet 4 mg (has no administration in time range)    Or  ondansetron (ZOFRAN) injection 4 mg (has no administration in time range)  methylPREDNISolone sodium succinate (SOLU-MEDROL) 40 mg/mL injection 40 mg (40 mg Intravenous Given 10/04/22 2214)    Followed by  predniSONE (DELTASONE) tablet 40 mg (has no administration in time range)  ipratropium-albuterol (DUONEB) 0.5-2.5 (3) MG/3ML nebulizer solution 3 mL (has no administration in time range)  albuterol (PROVENTIL) (2.5 MG/3ML) 0.083% nebulizer solution 2.5 mg (has no administration in time range)  HYDROcodone-acetaminophen (NORCO/VICODIN) 5-325 MG per tablet 1-2 tablet (has no  administration in time range)  ipratropium-albuterol (DUONEB) 0.5-2.5 (3) MG/3ML nebulizer solution 9 mL (9 mLs Nebulization Given 10/04/22 1844)    FINAL CLINICAL IMPRESSION(S) / ED DIAGNOSES   Final diagnoses:  COPD exacerbation (HCC)  Hypoxia     Rx / DC Orders   ED Discharge Orders     None        Note:  This document was prepared using Dragon voice recognition software and may include unintentional dictation errors.   Corena Herter, MD 10/05/22 Jacinta Shoe

## 2022-10-04 NOTE — Assessment & Plan Note (Signed)
Acute on chronic respiratory failure with hypoxia Patient with wheezing and dyspnea, O2 sat 76% requiring CPAP for transport by EMS, transition to BiPAP on arrival Scheduled and as needed nebulized bronchodilator treatments IV steroids Flutter valve, antitussives and supportive care Transition to O2 via nasal cannula as tolerated

## 2022-10-04 NOTE — ED Triage Notes (Signed)
Pt brought in via ems from home due to [facility]. Pt at 76% on ems arrival on room air. Pt placed on c-pap pta.  Pt vomited enroute. BG 134  EMS gave : 2 duo nebs given pta, 2 albuterol treatments, 125mg  solumedrol, 4mg  zofran, 2g magnesium.

## 2022-10-04 NOTE — H&P (Incomplete)
History and Physical    Patient: Madison Frank [id number]:[id number] DOB: 1946/01/16 DOA: 10/04/2022 DOS: the patient was seen and examined on 10/04/2022 PCP: Pcp, No  Patient coming from: Home  Chief Complaint:  Chief Complaint  Patient presents with   Shortness of Breath   HPI: Madison Frank is a 77 y.o. female with medical history significant of COPD with chronic respiratory failure on home O2 at 3 L who presents to the [facility] EMS with shortness of breath not responding to home inhalers.  EMS measured O2 sat 76% on room air and patient was placed on CPAP for transport.  Patient denies cough, fever or chills or chest pain or lower extremity swelling or edema. ED course and data review: Tachycardic to 108 and tachypneic to 22 with otherwise normal vitals.  O2 sat of 100% on CPAP transitioned to BiPAP on arrival Venous pH on BiPAP with pH 7.33 and pCO2 of 59.  Labs otherwise significant for normal CBC, CMP significant for potassium 3.2.  Troponin was 22, with BNP of 52.3.  COVID-negative. ED course and Data review: EKG, personally viewed and interpreted showing sinus tachycardia at 103 with LVH and nonspecific ST-T wave changes. Chest x-ray showing background of chronic lung disease without a superimposed focal airspace opacity. Patient received DuoNebs and Solu-Medrol and magnesium with EMS. Patient treated with at the additional DuoNebs in the ED. Hospitalist consulted for admission.   Review of Systems: As mentioned in the history of present illness. All other systems reviewed and are negative. Past Medical History:  Diagnosis Date   Acute bronchitis    Arthritis    Asthma    Cataracts, bilateral    immature   COPD (chronic obstructive pulmonary disease) (HCC)    uses Albuterol as needed;emphysema   GERD (gastroesophageal reflux disease)    but doesn't take any meds   Heart murmur    as a child   History of migraine    last one 03/11/13   History of shingles    Insomnia     occasionally but doesn' take any meds    Joint pain    Joint swelling    Nocturia    On home O2    @ 2L/m via N/C   Shortness of breath    with exertion   Sleep apnea    CPAP;sleep study done several yrs ago   Urinary frequency    Past Surgical History:  Procedure Laterality Date   ABDOMINAL HYSTERECTOMY     TOTAL KNEE ARTHROPLASTY Left 03/17/2013   Procedure: LEFT TOTAL KNEE ARTHROPLASTY;  Surgeon: Dannielle Huh, MD;  Location: [facility] OR;  Service: Orthopedics;  Laterality: Left;   Social History:  reports that she has quit smoking. She does not have any smokeless tobacco history on file. She reports that she does not drink alcohol and does not use drugs.  No Known Allergies  History reviewed. No pertinent family history.  Prior to Admission medications   Medication Sig Start Date End Date Taking? Authorizing Provider  albuterol (PROVENTIL HFA;VENTOLIN HFA) 108 (90 BASE) MCG/ACT inhaler Inhale into the lungs every 6 (six) hours as needed for wheezing or shortness of breath.    [provider]  albuterol (PROVENTIL) (2.5 MG/3ML) 0.083% nebulizer solution Take 2.5 mg by nebulization every 6 (six) hours as needed. 07/12/22 01/08/23  [provider]  aspirin 325 MG tablet Take 325 mg by mouth every 6 (six) hours as needed for mild pain.    [provider]  fluticasone furoate-vilanterol (BREO ELLIPTA) 200-25 MCG/ACT AEPB Inhale 1 puff into the lungs daily. 06/21/22 12/18/22  [provider]    Physical Exam: Vitals:   10/04/22 1900 10/04/22 1930 10/04/22 1945 10/04/22 2025  BP: 117/65 (!) 112/53  (!) 114/58  Pulse: (!) 101 95 93 99  Resp: (!) 22 (!) 21 18 18   Temp:    98.7 F (37.1 C)  TempSrc:    Oral  SpO2: 97% 98% 98% 98%   Physical Exam  Data Reviewed: Relevant notes from primary care and specialist visits, past discharge summaries as available in EHR, including Care Everywhere. Prior diagnostic testing as pertinent to current admission  diagnoses Updated medications and problem lists for reconciliation ED course, including vitals, labs, imaging, treatment and response to treatment Triage notes, nursing and pharmacy notes and ED provider's notes Notable results as noted in HPI   Assessment and Plan: * COPD exacerbation (HCC) Acute on chronic respiratory failure with hypoxia Patient with wheezing and dyspnea, O2 sat 76% requiring CPAP for transport by EMS, transition to BiPAP on arrival Scheduled and as needed nebulized bronchodilator treatments IV steroids Flutter valve, antitussives and supportive care Transition to O2 via nasal cannula as tolerated  History of migraine Not currently on any medication     Advance Care Planning:   Code Status: Prior   Consults:   Family Communication:   Severity of Illness: The appropriate patient status for this patient is INPATIENT. Inpatient status is judged to be reasonable and necessary in order to provide the required intensity of service to ensure the patient's safety. The patient's presenting symptoms, physical exam findings, and initial radiographic and laboratory data in the context of their chronic comorbidities is felt to place them at high risk for further clinical deterioration. Furthermore, it is not anticipated that the patient will be medically stable for discharge from the hospital within 2 midnights of admission.   * I certify that at the point of admission it is my clinical judgment that the patient will require inpatient hospital care spanning beyond 2 midnights from the point of admission due to high intensity of service, high risk for further deterioration and high frequency of surveillance required.*  Author: Andris Baumann, MD 10/04/2022 8:54 PM  For on call review [email or web address].

## 2022-10-04 NOTE — Assessment & Plan Note (Signed)
Not currently on any medication 

## 2022-10-05 ENCOUNTER — Inpatient Hospital Stay: Admit: 2022-10-05 | Payer: 59

## 2022-10-05 ENCOUNTER — Inpatient Hospital Stay
Admit: 2022-10-05 | Discharge: 2022-10-05 | Disposition: A | Discharge status: Home / Self Care | Payer: 59 | Attending: Internal Medicine | Admitting: Internal Medicine

## 2022-10-05 ENCOUNTER — Inpatient Hospital Stay (HOSPITAL_COMMUNITY)
Admit: 2022-10-05 | Discharge: 2022-10-05 | Disposition: A | Discharge status: Home / Self Care | Payer: 59 | Attending: Internal Medicine | Admitting: Internal Medicine

## 2022-10-05 DIAGNOSIS — R7989 Other specified abnormal findings of blood chemistry: Secondary | ICD-10-CM | POA: Diagnosis not present

## 2022-10-05 DIAGNOSIS — J441 Chronic obstructive pulmonary disease with (acute) exacerbation: Secondary | ICD-10-CM | POA: Diagnosis not present

## 2022-10-05 DIAGNOSIS — J9621 Acute and chronic respiratory failure with hypoxia: Secondary | ICD-10-CM

## 2022-10-05 LAB — ECHOCARDIOGRAM COMPLETE
AR max vel: 2.74 cm2
AV Area VTI: 2.37 cm2
AV Area mean vel: 2.54 cm2
AV Mean grad: 4 mmHg
AV Peak grad: 6.2 mmHg
Ao pk vel: 1.24 m/s
Area-P 1/2: 4.39 cm2
Calc EF: 52.7 %
Height: 64 in
MV VTI: 3.06 cm2
S' Lateral: 3.1 cm
Single Plane A2C EF: 63.9 %
Single Plane A4C EF: 40.7 %
Weight: 2081.14 oz

## 2022-10-05 LAB — LIPID PANEL
Cholesterol: 219 mg/dL — ABNORMAL HIGH (ref 0–200)
HDL: 73 mg/dL (ref >40)
LDL Cholesterol: 134 mg/dL — ABNORMAL HIGH (ref 0–99)
Total CHOL/HDL Ratio: 3 RATIO
Triglycerides: 58 mg/dL (ref <150)
VLDL: 12 mg/dL (ref 0–40)

## 2022-10-05 LAB — HEMOGLOBIN A1C
Hgb A1c MFr Bld: 6.2 % — ABNORMAL HIGH (ref 4.8–5.6)
Mean Plasma Glucose: 131.24 mg/dL

## 2022-10-05 LAB — BLOOD GAS, VENOUS
Acid-Base Excess: 3.6 mmol/L — ABNORMAL HIGH (ref 0.0–2.0)
Bicarbonate: 31.1 mmol/L — ABNORMAL HIGH (ref 20.0–28.0)
O2 Saturation: 73 %
Patient temperature: 37
pCO2, Ven: 59 mmHg (ref 44–60)
pH, Ven: 7.33 (ref 7.25–7.43)
pO2, Ven: 41 mmHg (ref 32–45)

## 2022-10-05 LAB — TSH: TSH: 0.232 u[IU]/mL — ABNORMAL LOW (ref 0.350–4.500)

## 2022-10-05 MED ORDER — PREDNISONE 20 MG PO TABS: 40.0000 mg | ORAL_TABLET | Freq: Every day | ORAL | 0 refills | Status: AC

## 2022-10-05 MED ORDER — POTASSIUM CHLORIDE CRYS ER 20 MEQ PO TBCR
40.0000 meq | EXTENDED_RELEASE_TABLET | ORAL | Status: AC
  Administered 2022-10-05 (×2): 40 meq via ORAL
  Filled 2022-10-05 (×2): qty 2

## 2022-10-05 MED ORDER — ACETAMINOPHEN 325 MG PO TABS: 650.0000 mg | ORAL_TABLET | Freq: Four times a day (QID) | ORAL | 0 refills | Status: DC | PRN

## 2022-10-05 MED ORDER — AZITHROMYCIN 500 MG PO TABS: 500.0000 mg | ORAL_TABLET | Freq: Every day | ORAL | 0 refills | Status: AC

## 2022-10-05 NOTE — Progress Notes (Signed)
*  PRELIMINARY RESULTS* Echocardiogram 2D Echocardiogram has been performed.  Madison Frank 10/05/2022, 10:58 AM

## 2022-10-05 NOTE — Assessment & Plan Note (Signed)
Troponin bump 22--> 164 Patient denies chest pain, EKG nonacute Suspecting demand ischemia secondary to wheezing and respiratory failure Will get echocardiogram to evaluate for wall motion abnormality

## 2022-10-05 NOTE — Progress Notes (Signed)
The patient walked 3 laps around the unit on room air. Her oxygen saturation did not drop below 92% during the duration of the walk. She does report using 3L o2 at home. She also reports intermittent dyspnea on exertion at home when doing household activities. She reports needing to sit down and rest when doing the dishes and then she wants to have oxygen to use.

## 2022-10-05 NOTE — Progress Notes (Addendum)
Progress Note   Patient: Madison Frank EAV:[id number] DOB: 1945-12-15 DOA: 10/04/2022     1 DOS: the patient was seen and examined on 10/05/2022   Subjective:  Patient seen and examined at bedside this morning Denies any acute complaints Respiratory function much better currently on room air not requiring oxygen Patient denies chest pain nausea vomiting or abdominal pain    Brief hospital course:  From HPI "Madison Frank is a 77 y.o. female with medical history significant of COPD with chronic respiratory failure on home O2 at 3 L who presents to the ED By EMS with shortness of breath not responding to home inhalers.  EMS measured O2 sat 76% on room air and patient was placed on CPAP for transport.  Patient denies cough, fever or chills or chest pain or lower extremity swelling or edema. ED course and data review: Tachycardic to 108 and tachypneic to 22 with otherwise normal vitals.  O2 sat of 100% on CPAP transitioned to BiPAP on arrival Venous pH on BiPAP with pH 7.33 and pCO2 of 59.  Labs otherwise significant for normal CBC, CMP significant for potassium 3.2.  Troponin was 22, with BNP of 52.3.  COVID-negative. ED course and Data review: EKG, personally viewed and interpreted showing sinus tachycardia at 103 with LVH and nonspecific ST-T wave changes. Chest x-ray showing background of chronic lung disease without a superimposed focal airspace opacity. Patient received DuoNebs and Solu-Medrol and magnesium with EMS. Patient treated with at the additional DuoNebs in the ED. Hospitalist consulted for admission.  "    Assessment and Plan:   COPD exacerbation (HCC) Acute on chronic respiratory failure with hypoxia Patient with wheezing and dyspnea, O2 sat 76% requiring CPAP for transport by EMS, transition to BiPAP on arrival Continue as needed nebulized bronchodilator treatments Continue steroid therapy Flutter valve, antitussives and supportive care Monitor saturation closely    Elevated troponin Troponin bump 22--> 164 Patient denies chest pain, EKG nonacute Suspecting demand ischemia secondary to wheezing and respiratory failure Echocardiogram was just reported as showing ejection fraction 35 to 40% as well as grade 1 diastolic dysfunction I have consulted cardiologist Dr. 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 Mariah Milling for any ischemic workup in the setting of new onset reduced ejection fraction According to patient she does not know of any history of CHF Obtain TSH, lipid panel, A1c We will introduce heart failure medications as blood pressure allows currently patient is relatively hypotensive  History of migraine Not currently on any medication    Advance Care Planning:   Code Status: Prior    Consults:    Family Communication: husband at bedside    Physical Exam: Vitals and nursing note reviewed.  Constitutional:      General: She is not in acute distress. HENT:     Head: Normocephalic and atraumatic.  Cardiovascular:     Rate and Rhythm: Regular rhythm.  Tachycardia resolved    Heart sounds: Normal heart sounds.  Pulmonary: Wheezing resolved Abdominal:     Palpations: Abdomen is soft.     Tenderness: There is no abdominal tenderness.  Neurological:     Mental Status: Mental status is at baseline.  Vitals:   10/05/22 0317 10/05/22 0407 10/05/22 0827 10/05/22 1221  BP:  (!) 95/51 (!) 98/53 (!) 104/56  Pulse:  93 72 81  Resp:  18 16 16   Temp:  97.7 F (36.5 C) 97.8 F (36.6 C) 97.9 F (36.6 C)  TempSrc:  Oral    SpO2: 99% 97% 92% 91%  Weight:      Height:        Data Reviewed: I have reviewed patient's EKG, that did not  show any ST segment elevation, I have reviewed below mentioned CBC as well as CMP.  I have reviewed patient's echocardiogram report     Latest Ref Rng & Units 10/04/2022    6:17 PM 09/04/2022    1:42 AM 03/18/2013    6:10 AM  CBC  WBC 4.0 - 10.5 K/uL 7.6  5.6  5.1   Hemoglobin 12.0 - 15.0 g/dL 82.9  56.2  9.5   Hematocrit 36.0 - 46.0 % 44.3  42.4  27.7   Platelets 150 - 400 K/uL 233  199  161         Latest Ref Rng & Units 10/04/2022    6:17 PM 09/04/2022    1:42 AM 03/18/2013    6:10 AM  CMP  Glucose 70 - 99 mg/dL 130  865  784   BUN 8 - 23 mg/dL 26  27  10    Creatinine 0.44 - 1.00 mg/dL 6.96  2.95  2.84   Sodium 135 - 145 mmol/L 139  143  141   Potassium 3.5 - 5.1 mmol/L 3.2  4.1  4.5   Chloride 98 - 111 mmol/L 101  110  107   CO2 22 - 32 mmol/L 29  26  26    Calcium 8.9 - 10.3 mg/dL 8.1  8.9  8.0   Total Protein 6.5 - 8.1 g/dL 6.7     Total Bilirubin 0.3 - 1.2 mg/dL 0.5     Alkaline Phos 38 - 126 U/L 43     AST 15 - 41 U/L 16     ALT 0 - 44 U/L 11       Family Communication: I discussed this with patient's husband    Author: Loyce Dys, MD 10/05/2022 4:58 PM  For on call review [email or web address].

## 2022-10-05 NOTE — Plan of Care (Signed)

## 2022-10-05 NOTE — TOC Initial Note (Addendum)
Transition of Care [facility]) - Initial/Assessment Note    Patient Details  Name: Madison Frank MRN: [id number] Date of Birth: 1945-03-26  Transition of Care [facility]) CM/SW Contact:    Darolyn Rua, LCSW Phone Number: 10/05/2022, 2:29 PM  Clinical Narrative:                  Update 4:18pm: [facility] reports since patient moved from [facility] and is not established in [place], and will be a new referral for [employer] due to insurance.  CSW spoke with Efraim Kaufmann and Cletis Athens with [employer] they will be providing o2 for patient to go home and they will be doing home set up.      CSW met with patient at bedside due to RN report of questions regarding portable oxygen concentrator.   Patient had 2 sisters and her husband at bedside, reports she recently moved and only has a tank at home interested in POC. Patient reports being active with [employer].   CSW spoke with Morrie Sheldon with [facility] who reports he is going to follow up on patient's POC and ensure services are transferred to [place].   Since patient recently moved she reports not establishing with a PCP yet but is aware she can go onto [facility] website to identify providers closest to her, choose a PCP to call and make apt.   Patient reports no other home needs at this time.   CSW confirmed address in chart as accurate.    Barriers to Discharge: Continued Medical Work up   Patient Goals and CMS Choice Patient states their goals for this hospitalization and ongoing recovery are:: to go home CMS Medicare.gov Compare Post Acute Care list provided to:: Patient Choice offered to / list presented to : Patient      Expected Discharge Plan and Services       Living arrangements for the past 2 months: Single Family Home                                      Prior Living Arrangements/Services Living arrangements for the past 2 months: Single Family Home Lives with:: Relatives                   Activities of Daily  Living Home Assistive Devices/Equipment: None ADL Screening (condition at time of admission) Patient's cognitive ability adequate to safely complete daily activities?: Yes Is the patient deaf or have difficulty hearing?: Yes Does the patient have difficulty seeing, even when wearing glasses/contacts?: No Does the patient have difficulty concentrating, remembering, or making decisions?: No Patient able to express need for assistance with ADLs?: No Does the patient have difficulty dressing or bathing?: No Independently performs ADLs?: Yes (appropriate for developmental age) Does the patient have difficulty walking or climbing stairs?: No Weakness of Legs: None Weakness of Arms/Hands: None  Permission Sought/Granted                  Emotional Assessment              Admission diagnosis:  Hypoxia [R09.02] COPD exacerbation (HCC) [J44.1] Patient Active Problem List   Diagnosis Date Noted   Elevated troponin 10/05/2022   COPD exacerbation (HCC) 10/04/2022   Acute on chronic respiratory failure with hypoxia (HCC) 10/04/2022   History of migraine 10/04/2022   S/P total knee arthroplasty 03/17/2013   PCP:  Pcp, No Pharmacy:   [facility] -  [place], [place] - [address] [place] [address] Phone: [phone] Fax: [phone]  [facility] [place], [place] - [address] [place] [address] Phone: [phone] Fax: [phone]     Social Determinants of Health (SDOH) Social History: SDOH Screenings   Food Insecurity: No Food Insecurity (10/05/2022)  Housing: Low Risk  (10/05/2022)  Transportation Needs: No Transportation Needs (10/05/2022)  Utilities: Not At Risk (10/05/2022)  Financial Resource Strain: Low Risk  (05/18/2022)   Received from [facility]  Physical Activity: Sufficiently Active (04/12/2022)   [facility]  Social  Connections: Socially Integrated (04/12/2022)   Received from [facility]  Stress: No Stress Concern Present (04/12/2022)   Received from [facility]  Tobacco Use: Medium Risk (10/04/2022)   SDOH Interventions:     Readmission Risk Interventions     No data to display

## 2022-10-06 ENCOUNTER — Encounter: Payer: Self-pay | Admitting: Internal Medicine

## 2022-10-06 DIAGNOSIS — J441 Chronic obstructive pulmonary disease with (acute) exacerbation: Secondary | ICD-10-CM | POA: Diagnosis not present

## 2022-10-06 DIAGNOSIS — I5021 Acute systolic (congestive) heart failure: Secondary | ICD-10-CM | POA: Diagnosis not present

## 2022-10-06 DIAGNOSIS — R7989 Other specified abnormal findings of blood chemistry: Secondary | ICD-10-CM

## 2022-10-06 LAB — CBC WITH DIFFERENTIAL/PLATELET
Abs Immature Granulocytes: 0.04 10*3/uL (ref 0.00–0.07)
Basophils Absolute: 0 10*3/uL (ref 0.0–0.1)
Basophils Relative: 0 %
Eosinophils Absolute: 0.1 10*3/uL (ref 0.0–0.5)
Eosinophils Relative: 1 %
HCT: 38.8 % (ref 36.0–46.0)
Hemoglobin: 12.8 g/dL (ref 12.0–15.0)
Immature Granulocytes: 1 %
Lymphocytes Relative: 16 %
Lymphs Abs: 1.2 10*3/uL (ref 0.7–4.0)
MCH: 32.3 pg (ref 26.0–34.0)
MCHC: 33 g/dL (ref 30.0–36.0)
MCV: 98 fL (ref 80.0–100.0)
Monocytes Absolute: 0.8 10*3/uL (ref 0.1–1.0)
Monocytes Relative: 10 %
Neutro Abs: 5.8 10*3/uL (ref 1.7–7.7)
Neutrophils Relative %: 72 %
Platelets: 231 10*3/uL (ref 150–400)
RBC: 3.96 MIL/uL (ref 3.87–5.11)
RDW: 12.6 % (ref 11.5–15.5)
WBC: 7.9 10*3/uL (ref 4.0–10.5)
nRBC: 0 % (ref 0.0–0.2)

## 2022-10-06 LAB — T4, FREE: Free T4: 0.58 ng/dL — ABNORMAL LOW (ref 0.61–1.12)

## 2022-10-06 LAB — BASIC METABOLIC PANEL
Anion gap: 7 (ref 5–15)
BUN: 22 mg/dL (ref 8–23)
CO2: 30 mmol/L (ref 22–32)
Calcium: 8.6 mg/dL — ABNORMAL LOW (ref 8.9–10.3)
Chloride: 103 mmol/L (ref 98–111)
Creatinine, Ser: 0.64 mg/dL (ref 0.44–1.00)
GFR, Estimated: >60 mL/min (ref >60)
Glucose, Bld: 96 mg/dL (ref 70–99)
Potassium: 4.6 mmol/L (ref 3.5–5.1)
Sodium: 140 mmol/L (ref 135–145)

## 2022-10-06 LAB — TROPONIN I (HIGH SENSITIVITY): Troponin I (High Sensitivity): 57 ng/L — ABNORMAL HIGH (ref <18)

## 2022-10-06 MED ORDER — BISOPROLOL FUMARATE 5 MG PO TABS: 2.5000 mg | ORAL_TABLET | Freq: Every day | ORAL | 1 refills | Status: DC

## 2022-10-06 MED ORDER — BISOPROLOL FUMARATE 5 MG PO TABS
2.5000 mg | ORAL_TABLET | Freq: Every day | ORAL | Status: DC
  Filled 2022-10-06: qty 0.5

## 2022-10-06 MED ORDER — ATORVASTATIN CALCIUM 80 MG PO TABS
80.0000 mg | ORAL_TABLET | Freq: Every day | ORAL | Status: DC
  Administered 2022-10-06: 80 mg via ORAL
  Filled 2022-10-06: qty 1

## 2022-10-06 MED ORDER — ATORVASTATIN CALCIUM 80 MG PO TABS: 80.0000 mg | ORAL_TABLET | Freq: Every day | ORAL | 1 refills | Status: DC

## 2022-10-06 MED ORDER — LOSARTAN POTASSIUM 25 MG PO TABS
12.5000 mg | ORAL_TABLET | Freq: Every day | ORAL | Status: DC
  Administered 2022-10-06: 12.5 mg via ORAL
  Filled 2022-10-06: qty 1

## 2022-10-06 MED ORDER — ASPIRIN 81 MG PO TBEC: 81.0000 mg | DELAYED_RELEASE_TABLET | Freq: Every day | ORAL | Status: DC

## 2022-10-06 MED ORDER — LOSARTAN POTASSIUM 25 MG PO TABS: 12.5000 mg | ORAL_TABLET | Freq: Every day | ORAL | 1 refills | Status: DC

## 2022-10-06 NOTE — Discharge Summary (Signed)
Physician Discharge Summary   Patient: Madison Frank MRN: [id number] DOB: [birth year]  Admit date:     10/04/2022  Discharge date: 10/06/22  Discharge Physician: Kirstie Peri   PCP: Pcp, No   Recommendations at discharge:  {Tip this will not be part of the note when signed- Example include specific recommendations for outpatient follow-up, pending tests to follow-up on. (Optional):26781}  Follow up with cardiology in 4-6 weeks  Follow up with PCP in 1-2 weeks   Discharge Diagnoses: Principal Problem:   COPD exacerbation (HCC) Active Problems:   Acute on chronic respiratory failure with hypoxia (HCC)   Elevated troponin   History of migraine  Resolved Problems:   * No resolved hospital problems. *  Hospital Course: No notes on file  Assessment and Plan: * COPD exacerbation (HCC) Acute on chronic respiratory failure with hypoxia Patient with wheezing and dyspnea, O2 sat 76% requiring CPAP for transport by EMS, transition to BiPAP on arrival Scheduled and as needed nebulized bronchodilator treatments IV steroids Flutter valve, antitussives and supportive care Transition to O2 via nasal cannula as tolerated  Elevated troponin Troponin bump 22--> 164 Patient denies chest pain, EKG nonacute Suspecting demand ischemia secondary to wheezing and respiratory failure Will get echocardiogram to evaluate for wall motion abnormality  History of migraine Not currently on any medication      {Tip this will not be part of the note when signed Body mass index is 22.33 kg/m. , ,  (Optional):26781}  {(NOTE) Pain control PDMP Statment (Optional):26782} Consultants: Cardiology  Procedures performed: None  Disposition: Home Diet recommendation:  Discharge Diet Orders (From admission, onward)     Start     Ordered   10/06/22 0000  Diet - low sodium heart healthy        10/06/22 1425           Cardiac diet DISCHARGE MEDICATION: Allergies as of 10/06/2022   No Known  Allergies      Medication List     TAKE these medications    acetaminophen 325 MG tablet Commonly known as: TYLENOL Take 2 tablets (650 mg total) by mouth every 6 (six) hours as needed for mild pain (or Fever >/= 101).   albuterol 108 (90 Base) MCG/ACT inhaler Commonly known as: VENTOLIN HFA Inhale into the lungs every 6 (six) hours as needed for wheezing or shortness of breath.   albuterol (2.5 MG/3ML) 0.083% nebulizer solution Commonly known as: PROVENTIL Take 2.5 mg by nebulization every 6 (six) hours as needed.   aspirin 325 MG tablet Take 325 mg by mouth every 6 (six) hours as needed for mild pain.   atorvastatin 80 MG tablet Commonly known as: LIPITOR Take 1 tablet (80 mg total) by mouth daily. Start taking on: October 07, 2022   azithromycin 500 MG tablet Commonly known as: Zithromax Take 1 tablet (500 mg total) by mouth daily for 3 days.   bisoprolol 5 MG tablet Commonly known as: ZEBETA Take 0.5 tablets (2.5 mg total) by mouth daily.   fluticasone furoate-vilanterol 200-25 MCG/ACT Aepb Commonly known as: BREO ELLIPTA Inhale 1 puff into the lungs daily.   losartan 25 MG tablet Commonly known as: COZAAR Take 0.5 tablets (12.5 mg total) by mouth daily. Start taking on: October 07, 2022   predniSONE 20 MG tablet Commonly known as: DELTASONE Take 2 tablets (40 mg total) by mouth daily with breakfast for 4 days.        Discharge Exam: Filed Weights   10/04/22 2030  10/04/22 2200  Weight: 59 kg 59 kg   ***  Condition at discharge: good  The results of significant diagnostics from this hospitalization (including imaging, microbiology, ancillary and laboratory) are listed below for reference.   Imaging Studies: ECHOCARDIOGRAM COMPLETE  Result Date: 10/05/2022    ECHOCARDIOGRAM REPORT   Patient Name:   Madison Frank Date of Exam: 10/05/2022 Medical Rec #:  [id number]       Height:       64.0 in Accession #:    [id number]      Weight:       130.1 lb Date  of Birth:  12-17-1945       BSA:          1.629 m Patient Age:    77 years        BP:           95/51 mmHg Patient Gender: F               HR:           93 bpm. Exam Location:  [facility] Procedure: 2D Echo, Cardiac Doppler and Color Doppler Indications:     Elevated Troponin  History:         Patient has no prior history of Echocardiogram examinations.                  COPD; Signs/Symptoms:Murmur.  Sonographer:     Cristela Blue Referring Phys:  [id number] Andris Baumann Diagnosing Phys: Julien Nordmann MD  Sonographer Comments: Global longitudinal strain was attempted. IMPRESSIONS  1. Left ventricular ejection fraction, by estimation, is 35 to 40%. The left ventricle has moderately decreased function. The left ventricle demonstrates global hypokinesis, basal wall motion best preserved, unable to exclude stress cardiomyopathy. Left  ventricular diastolic parameters are consistent with Grade I diastolic dysfunction (impaired relaxation). The average left ventricular global longitudinal strain is -9.0 %.  2. Right ventricular systolic function is normal. The right ventricular size is normal. There is normal pulmonary artery systolic pressure. The estimated right ventricular systolic pressure is 30.6 mmHg.  3. The mitral valve is normal in structure. No evidence of mitral valve regurgitation. No evidence of mitral stenosis.  4. The aortic valve has an indeterminant number of cusps. Aortic valve regurgitation is mild. Aortic valve sclerosis is present, with no evidence of aortic valve stenosis.  5. The inferior vena cava is normal in size with greater than 50% respiratory variability, suggesting right atrial pressure of 3 mmHg. FINDINGS  Left Ventricle: Left ventricular ejection fraction, by estimation, is 35 to 40%. The left ventricle has moderately decreased function. The left ventricle demonstrates global hypokinesis. The average left ventricular global longitudinal strain is -9.0 %.  The left ventricular internal cavity size  was normal in size. There is no left ventricular hypertrophy. Left ventricular diastolic parameters are consistent with Grade I diastolic dysfunction (impaired relaxation). Right Ventricle: The right ventricular size is normal. No increase in right ventricular wall thickness. Right ventricular systolic function is normal. There is normal pulmonary artery systolic pressure. The tricuspid regurgitant velocity is 2.53 m/s, and  with an assumed right atrial pressure of 5 mmHg, the estimated right ventricular systolic pressure is 30.6 mmHg. Left Atrium: Left atrial size was normal in size. Right Atrium: Right atrial size was normal in size. Pericardium: There is no evidence of pericardial effusion. Mitral Valve: The mitral valve is normal in structure. There is mild calcification of the mitral valve leaflet(s). No evidence of  mitral valve regurgitation. No evidence of mitral valve stenosis. MV peak gradient, 8.0 mmHg. The mean mitral valve gradient  is 2.0 mmHg. Tricuspid Valve: The tricuspid valve is normal in structure. Tricuspid valve regurgitation is not demonstrated. No evidence of tricuspid stenosis. Aortic Valve: The aortic valve has an indeterminant number of cusps. Aortic valve regurgitation is mild. Aortic valve sclerosis is present, with no evidence of aortic valve stenosis. Aortic valve mean gradient measures 4.0 mmHg. Aortic valve peak gradient measures 6.2 mmHg. Aortic valve area, by VTI measures 2.37 cm. Pulmonic Valve: The pulmonic valve was normal in structure. Pulmonic valve regurgitation is not visualized. No evidence of pulmonic stenosis. Aorta: The aortic root is normal in size and structure. Venous: The inferior vena cava is normal in size with greater than 50% respiratory variability, suggesting right atrial pressure of 3 mmHg. IAS/Shunts: No atrial level shunt detected by color flow Doppler.  LEFT VENTRICLE PLAX 2D LVIDd:         4.40 cm     Diastology LVIDs:         3.10 cm     LV e' medial:     6.96 cm/s LV PW:         1.00 cm     LV E/e' medial:  11.4 LV IVS:        1.10 cm     LV e' lateral:   6.64 cm/s LVOT diam:     2.00 cm     LV E/e' lateral: 11.9 LV SV:         62 LV SV Index:   38          2D Longitudinal Strain LVOT Area:     3.14 cm    2D Strain GLS Avg:     -9.0 %  LV Volumes (MOD) LV vol d, MOD A2C: 90.6 ml LV vol d, MOD A4C: 56.0 ml LV vol s, MOD A2C: 32.7 ml LV vol s, MOD A4C: 33.2 ml LV SV MOD A2C:     57.9 ml LV SV MOD A4C:     56.0 ml LV SV MOD BP:      39.8 ml RIGHT VENTRICLE RV Basal diam:  2.40 cm RV Mid diam:    2.30 cm RV S prime:     14.40 cm/s TAPSE (M-mode): 1.3 cm LEFT ATRIUM           Index        RIGHT ATRIUM          Index LA diam:      2.40 cm 1.47 cm/m   RA Area:     9.89 cm LA Vol (A4C): 34.9 ml 21.42 ml/m  RA Volume:   20.40 ml 12.52 ml/m  AORTIC VALVE AV Area (Vmax):    2.74 cm AV Area (Vmean):   2.54 cm AV Area (VTI):     2.37 cm AV Vmax:           124.00 cm/s AV Vmean:          92.200 cm/s AV VTI:            0.260 m AV Peak Grad:      6.2 mmHg AV Mean Grad:      4.0 mmHg LVOT Vmax:         108.00 cm/s LVOT Vmean:        74.600 cm/s LVOT VTI:          0.196 m LVOT/AV VTI ratio: 0.75  AORTA Ao Root diam: 2.80 cm MITRAL VALVE                TRICUSPID VALVE MV Area (PHT): 4.39 cm     TR Peak grad:   25.6 mmHg MV Area VTI:   3.06 cm     TR Vmax:        253.00 cm/s MV Peak grad:  8.0 mmHg MV Mean grad:  2.0 mmHg     SHUNTS MV Vmax:       1.41 m/s     Systemic VTI:  0.20 m MV Vmean:      71.9 cm/s    Systemic Diam: 2.00 cm MV Decel Time: 173 msec MV E velocity: 79.10 cm/s MV A velocity: 129.00 cm/s MV E/A ratio:  0.61 Julien Nordmann MD Electronically signed by Julien Nordmann MD Signature Date/Time: 10/05/2022/4:22:53 PM    Final    DG Chest Portable 1 View  Result Date: 10/04/2022 CLINICAL DATA:  dyspnea, hypoxia EXAM: PORTABLE CHEST 1 VIEW COMPARISON:  CXR 09/04/22 FINDINGS: No pleural effusion. No pneumothorax. Normal cardiac and mediastinal contours. No focal  airspace opacity. No radiographically apparent displaced rib fractures. There are prominent bilateral interstitial opacities likely represent a background of chronic lung disease. Visualized upper abdomen is unremarkable. Degenerative changes of the bilateral glenohumeral joints. IMPRESSION: Background of chronic lung disease without a superimposed focal airspace opacity. Electronically Signed   By: Lorenza Cambridge M.D.   On: 10/04/2022 18:46    Microbiology: Results for orders placed or performed during the hospital encounter of 10/04/22  SARS Coronavirus 2 by RT PCR (hospital order, performed in [facility] hospital lab) *cepheid single result test* Anterior Nasal Swab     Status: None   Collection Time: 10/04/22  6:17 PM   Specimen: Anterior Nasal Swab  Result Value Ref Range Status   SARS Coronavirus 2 by RT PCR NEGATIVE NEGATIVE Final    Comment: (NOTE) SARS-CoV-2 target nucleic acids are NOT DETECTED.  The SARS-CoV-2 RNA is generally detectable in upper and lower respiratory specimens during the acute phase of infection. The lowest concentration of SARS-CoV-2 viral copies this assay can detect is 250 copies / mL. A negative result does not preclude SARS-CoV-2 infection and should not be used as the sole basis for treatment or other patient management decisions.  A negative result may occur with improper specimen collection / handling, submission of specimen other than nasopharyngeal swab, presence of viral mutation(s) within the areas targeted by this assay, and inadequate number of viral copies (<250 copies / mL). A negative result must be combined with clinical observations, patient history, and epidemiological information.  Fact Sheet for Patients:   RoadLapTop.co.za  Fact Sheet for Healthcare Providers: http://kim-miller.com/  This test is not yet approved or  cleared by the [place] FDA and has been authorized for detection  and/or diagnosis of SARS-CoV-2 by FDA under an Emergency Use Authorization (EUA).  This EUA will remain in effect (meaning this test can be used) for the duration of the COVID-19 declaration under Section 564(b)(1) of the Act, 21 U.S.C. section 360bbb-3(b)(1), unless the authorization is terminated or revoked sooner.  Performed at [facility], [address] [place], [place] [address]     Labs: CBC: Recent Labs  Lab 10/04/22 1817 10/06/22 0305  WBC 7.6 7.9  NEUTROABS 4.1 5.8  HGB 14.5 12.8  HCT 44.3 38.8  MCV 98.7 98.0  PLT 233 231   Basic Metabolic Panel: Recent Labs  Lab 10/04/22  1817 10/06/22 0305  NA 139 140  K 3.2* 4.6  CL 101 103  CO2 29 30  GLUCOSE 135* 96  BUN 26* 22  CREATININE 0.60 0.64  CALCIUM 8.1* 8.6*   Liver Function Tests: Recent Labs  Lab 10/04/22 1817  AST 16  ALT 11  ALKPHOS 43  BILITOT 0.5  PROT 6.7  ALBUMIN 3.8   CBG: No results for input(s): "GLUCAP" in the last 168 hours.  Discharge time spent: greater than 30 minutes.  Signed: Kirstie Peri, MD [facility] 10/06/2022

## 2022-10-06 NOTE — Progress Notes (Signed)
Attempted to call patient's son again without answer.

## 2022-10-06 NOTE — Progress Notes (Signed)
Obtained consent from patient to call her son, Velda Shell at [phone]. The number provided was not in service.

## 2022-10-06 NOTE — Consult Note (Signed)
Cardiology Consultation:   Patient ID: Madison Frank; [id number]; 1945/04/30   Admit date: 10/04/2022 Date of Consult: 10/06/2022  Primary Care Provider: Pcp, No Primary Cardiologist: New - consult by Kirke Corin Primary Electrophysiologist:  None   Patient Profile:   Madison Frank is a 77 y.o. female with a hx of chronic hypoxic and hypercapnic respiratory failure on supplemental oxygen at 3 L at baseline, COPD with frequent exacerbations, sleep apnea, migraine, osteoarthritis, and GERD who is being seen today for the evaluation of cardiomyopathy at the request of Dr. Meriam Sprague.  History of Present Illness:   [name] has frequent ED visits/admissions for COPD exacerbations. Echo through [facility] in 11/2021 (during COPD exacerbation visit) showed an EF of 55-60%, mild mitral regurgitation, trivial tricuspid regurgitation, and an estimated PASP of 33 mmHg.   She was admitted on 10/04/2022 with acute on chronic hypoxic and hypercapnic respiratory failure in the setting of COPD exacerbation. In the field, she was found to be hypoxic with oxygen saturations of 76% on room air and initially required CPAP-BiPAP. High sensitivity troponin 22 with a delta troponin of 164. BNP 52. EKG showed sinus tachycardia, 102 bpm, baseline artifact with nonspecific st/t changes. CXR with chronic lung disease without superimposed focal airspace opacity. She has been treated for COPD exacerbation with Duonebs and Solu-Medrol. Echo showed an EF of 35-40% with global hypokinesis with basal wall motion best preserved (unable to exclude stress-induced cardiomyopathy), Gr1DD, normal RV systolic function and ventricular cavity size, normal PASP, aortic valve sclerosis without stenosis, and an estimated right atrial pressure of 3 mmHg. Cardiology is asked to evaluate given new cardiomyopathy. Never with chest pain, orthopnea, lower extremity swelling, abdominal distension, PND, or early satiety. Dyspnea that brought her to the hospital  felt exactly like her prior COPD exacerbations. Patient and husband feel like this triggered episode was in the setting of eating at a restaurant that had broken air conditioning. Currently, notes improving dyspnea and is back on baseline supplemental oxygen at 3 L via nasal cannula.     Past Medical History:  Diagnosis Date   Acute bronchitis    Arthritis    Asthma    Cataracts, bilateral    immature   COPD (chronic obstructive pulmonary disease) (HCC)    uses Albuterol as needed;emphysema   GERD (gastroesophageal reflux disease)    but doesn't take any meds   Heart murmur    as a child   History of migraine    last one 03/11/13   History of shingles    Insomnia    occasionally but doesn' take any meds    Joint pain    Joint swelling    Nocturia    On home O2    @ 2L/m via N/C   Shortness of breath    with exertion   Sleep apnea    CPAP;sleep study done several yrs ago   Urinary frequency     Past Surgical History:  Procedure Laterality Date   ABDOMINAL HYSTERECTOMY     TOTAL KNEE ARTHROPLASTY Left 03/17/2013   Procedure: LEFT TOTAL KNEE ARTHROPLASTY;  Surgeon: Dannielle Huh, MD;  Location: [facility] OR;  Service: Orthopedics;  Laterality: Left;     Home Meds: Prior to Admission medications   Medication Sig Start Date End Date Taking? Authorizing Provider  azithromycin (ZITHROMAX) 500 MG tablet Take 1 tablet (500 mg total) by mouth daily for 3 days. 10/05/22 10/08/22 Yes Loyce Dys, MD  acetaminophen (TYLENOL) 325 MG tablet Take  2 tablets (650 mg total) by mouth every 6 (six) hours as needed for mild pain (or Fever >/= 101). 10/05/22   Loyce Dys, MD  albuterol (PROVENTIL HFA;VENTOLIN HFA) 108 (90 BASE) MCG/ACT inhaler Inhale into the lungs every 6 (six) hours as needed for wheezing or shortness of breath.    [provider]  albuterol (PROVENTIL) (2.5 MG/3ML) 0.083% nebulizer solution Take 2.5 mg by nebulization every 6 (six) hours as needed. 07/12/22 01/08/23   [provider]  aspirin 325 MG tablet Take 325 mg by mouth every 6 (six) hours as needed for mild pain.    [provider]  fluticasone furoate-vilanterol (BREO ELLIPTA) 200-25 MCG/ACT AEPB Inhale 1 puff into the lungs daily. 06/21/22 12/18/22  [provider]  predniSONE (DELTASONE) 20 MG tablet Take 2 tablets (40 mg total) by mouth daily with breakfast for 4 days. 10/06/22 10/10/22  Loyce Dys, MD    Inpatient Medications: Scheduled Meds:  enoxaparin (LOVENOX) injection  40 mg Subcutaneous Q24H   ipratropium-albuterol  3 mL Nebulization Q6H   predniSONE  40 mg Oral Q breakfast   Continuous Infusions:  PRN Meds: acetaminophen **OR** acetaminophen, albuterol, HYDROcodone-acetaminophen, ondansetron **OR** ondansetron (ZOFRAN) IV  Allergies:  No Known Allergies  Social History:   Social History   Socioeconomic History   Marital status: Married    Spouse name: Not on file   Number of children: Not on file   Years of education: Not on file   Highest education level: Not on file  Occupational History   Not on file  Tobacco Use   Smoking status: Former   Smokeless tobacco: Not on file  Substance and Sexual Activity   Alcohol use: No   Drug use: No   Sexual activity: Yes    Birth control/protection: Surgical  Other Topics Concern   Not on file  Social History Narrative   Not on file   Social Determinants of Health   Financial Resource Strain: Low Risk  (05/18/2022)   Received from [facility]   Overall Financial Resource Strain (CARDIA)    Difficulty of Paying Living Expenses: Not hard at all  Food Insecurity: No Food Insecurity (10/05/2022)   Hunger Vital Sign    Worried About Running Out of Food in the Last Year: Never true    Ran Out of Food in the Last Year: Never true  Transportation Needs: No Transportation Needs (10/05/2022)   PRAPARE - Administrator, Civil Service (Medical): No    Lack of Transportation (Non-Medical):  No  Physical Activity: Sufficiently Active (04/12/2022)   Received from [facility]   Exercise Vital Sign    Days of Exercise per Week: 5 days    Minutes of Exercise per Session: 30 min  Stress: No Stress Concern Present (04/12/2022)   Received from [facility] - Occupational Stress Questionnaire    Feeling of Stress : Not at all  Social Connections: Socially Integrated (04/12/2022)   Received from [facility]   Social Connection and Isolation Panel [NHANES]    Frequency of Communication with Friends and Family: More than three times a week    Frequency of Social Gatherings with Friends and Family: More than three times a week    Attends Religious Services: More than 4 times per year    Active Member of [employer] or Organizations: Yes    Attends Banker Meetings: More than 4 times  per year    Marital Status: Married  Catering manager Violence: Not At Risk (10/05/2022)   Humiliation, Afraid, Rape, and Kick questionnaire    Fear of Current or Ex-Partner: No    Emotionally Abused: No    Physically Abused: No    Sexually Abused: No     Family History:   Family History  Problem Relation Age of Onset   Osteoarthritis Mother    CAD Father     ROS:  Review of Systems  Constitutional:  Positive for malaise/fatigue. Negative for chills, diaphoresis, fever and weight loss.  HENT:  Negative for congestion.   Eyes:  Negative for discharge and redness.  Respiratory:  Positive for cough, shortness of breath and wheezing. Negative for hemoptysis and sputum production.   Cardiovascular:  Negative for chest pain, palpitations, orthopnea, claudication, leg swelling and PND.  Gastrointestinal:  Negative for abdominal pain, heartburn, nausea and vomiting.  Musculoskeletal:  Negative for falls and myalgias.  Skin:  Negative for rash.  Neurological:  Positive for weakness. Negative for dizziness, tingling, tremors, sensory change,  speech change, focal weakness and loss of consciousness.  Endo/Heme/Allergies:  Does not bruise/bleed easily.  Psychiatric/Behavioral:  Negative for substance abuse. The patient is not nervous/anxious.   All other systems reviewed and are negative.     Physical Exam/Data:   Vitals:   10/05/22 1955 10/06/22 0019 10/06/22 0515 10/06/22 0803  BP: 111/63 (!) 108/58 (!) 123/55 130/62  Pulse: 89 67 63 75  Resp: 17 19 16 18   Temp: 98.5 F (36.9 C) 98.2 F (36.8 C) 98.3 F (36.8 C) 98.4 F (36.9 C)  TempSrc:      SpO2: 98% 94% 95% 96%  Weight:      Height:        Intake/Output Summary (Last 24 hours) at 10/06/2022 0840 Last data filed at 10/05/2022 1100 Gross per 24 hour  Intake 240 ml  Output --  Net 240 ml   Filed Weights   10/04/22 2030 10/04/22 2200  Weight: 59 kg 59 kg   Body mass index is 22.33 kg/m.   Physical Exam: General: Well developed, well nourished, in no acute distress. Head: Normocephalic, atraumatic, sclera non-icteric, no xanthomas, nares without discharge.  Neck: Negative for carotid bruits. JVD not elevated. Lungs: Diminished breath sounds bilaterally. Breathing is unlabored on supplemental oxygen via nasal canula at 3 L.  Heart: RRR with S1 S2. No murmurs, rubs, or gallops appreciated. Abdomen: Soft, non-tender, non-distended with normoactive bowel sounds. No hepatomegaly. No rebound/guarding. No obvious abdominal masses. Msk:  Strength and tone appear normal for age. Extremities: No clubbing or cyanosis. No edema. Distal pedal pulses are 2+ and equal bilaterally. Neuro: Alert and oriented X 3. No facial asymmetry. No focal deficit. Moves all extremities spontaneously. Psych:  Responds to questions appropriately with a normal affect.   EKG:  The EKG was personally reviewed and demonstrates: sinus tachycardia, 102 bpm, baseline artifact with nonspecific st/t changes Telemetry:  Telemetry was personally reviewed and demonstrates: SR  Weights: Filed  Weights   10/04/22 2030 10/04/22 2200  Weight: 59 kg 59 kg    Relevant CV Studies:  2D echo 10/05/2022: 1. Left ventricular ejection fraction, by estimation, is 35 to 40%. The  left ventricle has moderately decreased function. The left ventricle  demonstrates global hypokinesis, basal wall motion best preserved, unable  to exclude stress cardiomyopathy. Left   ventricular diastolic parameters are consistent with Grade I diastolic  dysfunction (impaired relaxation). The average left ventricular global  longitudinal strain is -9.0 %.   2. Right ventricular systolic function is normal. The right ventricular  size is normal. There is normal pulmonary artery systolic pressure. The  estimated right ventricular systolic pressure is 30.6 mmHg.   3. The mitral valve is normal in structure. No evidence of mitral valve  regurgitation. No evidence of mitral stenosis.   4. The aortic valve has an indeterminant number of cusps. Aortic valve  regurgitation is mild. Aortic valve sclerosis is present, with no evidence  of aortic valve stenosis.   5. The inferior vena cava is normal in size with greater than 50%  respiratory variability, suggesting right atrial pressure of 3 mmHg.  __________  2D echo 12/18/2021 [facility]): Summary   1. The left ventricular systolic function is normal, LVEF is visually  estimated at 55-60%.    2. The left ventricle is normal in size with mildly increased wall  thickness.   3. The right ventricle is normal in size, with normal systolic function.    4. There is mild aortic regurgitation.    5. There is trivial tricuspid regurgitation.    6. Estimated pulmonary arterial systolic pressure is 33 mmHg.   Laboratory Data:  Chemistry Recent Labs  Lab 10/04/22 1817 10/06/22 0305  NA 139 140  K 3.2* 4.6  CL 101 103  CO2 29 30  GLUCOSE 135* 96  BUN 26* 22  CREATININE 0.60 0.64  CALCIUM 8.1* 8.6*  GFRNONAA >60 >60  ANIONGAP 9 7    Recent Labs  Lab 10/04/22 1817   PROT 6.7  ALBUMIN 3.8  AST 16  ALT 11  ALKPHOS 43  BILITOT 0.5   Hematology Recent Labs  Lab 10/04/22 1817 10/06/22 0305  WBC 7.6 7.9  RBC 4.49 3.96  HGB 14.5 12.8  HCT 44.3 38.8  MCV 98.7 98.0  MCH 32.3 32.3  MCHC 32.7 33.0  RDW 12.7 12.6  PLT 233 231   Cardiac EnzymesNo results for input(s): "TROPONINI" in the last 168 hours. No results for input(s): "TROPIPOC" in the last 168 hours.  BNP Recent Labs  Lab 10/04/22 1817  BNP 52.3    DDimer No results for input(s): "DDIMER" in the last 168 hours.  Radiology/Studies:   DG Chest Portable 1 View  Result Date: 10/04/2022 IMPRESSION: Background of chronic lung disease without a superimposed focal airspace opacity. Electronically Signed   By: Lorenza Cambridge M.D.   On: 10/04/2022 18:46    Assessment and Plan:   1. Acute HFrEF: -Euvolemic -Echo consistent with stress-induced cardiomyopathy, possibly in the setting of COPD exacerbation  -Will need ischemic evaluation as respiratory status improves  -Add low-dose losartan -Defer initiation of beta blocker at this time in the setting of respiratory failure/COPD exacerbation  -Escalate GDMT as able  2. Elevated high-sensitivity troponin: -Initial troponin 22 with a delta troponin of 164 on 8/14, trend to peak -Possibly supply demand ischemia in the setting of COPD exacerbation  -However, cannot exclude ischemic heart disease given multiple risk factors including prior tobacco use and HLD -Defer heparin gtt at this time -Will need ischemic evaluation with timing to be determined based on troponin trend and respiratory status (husband does not want the patient to stay admitted over the weekend for evaluation on Monday) -ASA -Add Lipitor 80 mg   3. Acute on chronic hypoxic and hypercapnic respiratory failure: -Secondary to COPD exacerbation -Cannot exclude component of HFrEF at this time -Nebs and steroids per IM  4. Abnormal TSH: -Check free T4  5.  Hypokalemia: -Repleted   6. HLD: -LDL 134 -Lipitor as above    For questions or updates, please contact [facility] Please consult [email or web address] for contact info under Cardiology/STEMI.   Signed, Eula Listen, PA-C [facility] Pager: [phone] 10/06/2022, 8:40 AM

## 2022-10-06 NOTE — Progress Notes (Signed)
Attempted to call patient's son at updated number [phone]. No answer x 2. Left voicemail.

## 2022-10-06 NOTE — Care Management Important Message (Signed)
Important Message  Patient Details  Name: Madison Frank MRN: [id number] Date of Birth: November 22, 1945   Medicare Important Message Given:  N/A - LOS <3 / Initial given by admissions     Johnell Comings 10/06/2022, 9:57 AM

## 2022-10-06 NOTE — Plan of Care (Signed)

## 2022-10-06 NOTE — Plan of Care (Signed)

## 2022-10-30 ENCOUNTER — Encounter: Payer: Self-pay | Admitting: Physician Assistant

## 2022-10-30 ENCOUNTER — Ambulatory Visit: Payer: 59 | Attending: Physician Assistant | Admitting: Physician Assistant

## 2022-10-30 VITALS — BP 120/78 | HR 90 | Ht 64.0 in | Wt 117.8 lb

## 2022-10-30 DIAGNOSIS — I5020 Unspecified systolic (congestive) heart failure: Secondary | ICD-10-CM

## 2022-10-30 DIAGNOSIS — R7989 Other specified abnormal findings of blood chemistry: Secondary | ICD-10-CM | POA: Diagnosis not present

## 2022-10-30 DIAGNOSIS — J449 Chronic obstructive pulmonary disease, unspecified: Secondary | ICD-10-CM

## 2022-10-30 NOTE — Progress Notes (Signed)
Cardiology Office Note    Date:  10/30/2022   ID:  Madison Frank, DOB 1945/09/02, MRN [id number]  PCP:  Pcp, No  Cardiologist:  Madison Bears, MD  Electrophysiologist:  None   Chief Complaint: Hospital follow-up  History of Present Illness:   Madison Frank is a 77 y.o. female with history of chronic hypoxic and hypercapnic respiratory failure on supplemental oxygen at 3 L at baseline, HFrEF, COPD with frequent exacerbations, sleep apnea, migraine, osteoarthritis, and GERD who was recently admitted to [facility] for acute on chronic hypoxic respiratory failure in the setting of COPD exacerbation in 09/2022, and presents for hospital follow-up.  She has frequent ED visits/admissions for COPD exacerbations. Echo through [facility] in 11/2021 (during COPD exacerbation visit) showed an EF of 55-60%, mild mitral regurgitation, trivial tricuspid regurgitation, and an estimated PASP of 33 mmHg.  She was admitted in 09/2022 with acute on chronic hypoxic and hypercapnic respiratory failure in the setting of COPD exacerbation briefly requiring BiPAP.  High-sensitivity troponin 22 with a delta and peak troponin 164.  BNP 52.  Chest x-ray showed chronic lung disease without superimposed focal airspace opacity.  EKG showed sinus tachycardia, 102 bpm, with baseline artifact and nonspecific ST-T changes.  Echo showed an EF of 35-40% with global hypokinesis with basal wall motion best preserved (unable to exclude stress-induced cardiomyopathy), Gr1DD, normal RV systolic function and ventricular cavity size, normal PASP, aortic valve sclerosis without stenosis, and an estimated right atrial pressure of 3 mmHg.  In this setting, she was evaluated by cardiology with mildly reduced LV systolic function felt to be consistent with stress-induced cardiomyopathy in the setting of COPD exacerbation.  Outpatient ischemic evaluation was recommended once she had improved from a pulmonary status.  She comes in accompanied by her husband  today and is doing well from a cardiac perspective.  She is without symptoms of angina or cardiac decompensation.  She does feel like her COPD is acting up a little bit.  No significant lower extremity swelling, abdominal distention, orthopnea, PND, early satiety.  No falls or symptoms concerning for bleeding.  No dizziness, presyncope, or syncope.  Not weighing at home at this time.  She is unclear of what medications that she is currently taking.  Wears supplemental oxygen as needed at home.  Establishing with a new PCP.  Does not currently have a pulmonologist.   Labs independently reviewed: 09/2022 - potassium 4.6, BUN 22, serum creatinine 0.64, Hgb 12.8, PLT 231, TSH 0.232, free T4 low at 0.58 with TC 219, TG 58, HDL 73, LDL 134, A1c 6.2, albumin 3.8, AST/ALT normal  Past Medical History:  Diagnosis Date   Acute bronchitis    Arthritis    Asthma    Cataracts, bilateral    immature   COPD (chronic obstructive pulmonary disease) (HCC)    uses Albuterol as needed;emphysema   GERD (gastroesophageal reflux disease)    but doesn't take any meds   Heart murmur    as a child   History of migraine    last one 03/11/13   History of shingles    Insomnia    occasionally but doesn' take any meds    Joint pain    Joint swelling    Nocturia    On home O2    @ 2L/m via N/C   Shortness of breath    with exertion   Sleep apnea    CPAP;sleep study done several yrs ago   Urinary frequency  Past Surgical History:  Procedure Laterality Date   ABDOMINAL HYSTERECTOMY     TOTAL KNEE ARTHROPLASTY Left 03/17/2013   Procedure: LEFT TOTAL KNEE ARTHROPLASTY;  Surgeon: Dannielle Huh, MD;  Location: [facility] OR;  Service: Orthopedics;  Laterality: Left;    Current Medications: Current Meds  Medication Sig   acetaminophen (TYLENOL) 325 MG tablet Take 2 tablets (650 mg total) by mouth every 6 (six) hours as needed for mild pain (or Fever >/= 101).   albuterol (VENTOLIN HFA) 108 (90 Base) MCG/ACT inhaler  Inhale into the lungs.   aspirin 325 MG tablet Take 325 mg by mouth every 6 (six) hours as needed for mild pain.   atorvastatin (LIPITOR) 80 MG tablet Take 1 tablet (80 mg total) by mouth daily.   bisoprolol (ZEBETA) 5 MG tablet Take 0.5 tablets (2.5 mg total) by mouth daily.   fluticasone furoate-vilanterol (BREO ELLIPTA) 200-25 MCG/ACT AEPB Inhale 1 puff into the lungs daily.   ipratropium-albuterol (DUONEB) 0.5-2.5 (3) MG/3ML SOLN Inhale into the lungs.   losartan (COZAAR) 25 MG tablet Take 0.5 tablets (12.5 mg total) by mouth daily.    Allergies:   Patient has no known allergies.   Social History   Socioeconomic History   Marital status: Married    Spouse name: Not on file   Number of children: Not on file   Years of education: Not on file   Highest education level: Not on file  Occupational History   Not on file  Tobacco Use   Smoking status: Former   Smokeless tobacco: Not on file  Substance and Sexual Activity   Alcohol use: No   Drug use: No   Sexual activity: Yes    Birth control/protection: Surgical  Other Topics Concern   Not on file  Social History Narrative   Not on file   Social Determinants of Health   Financial Resource Strain: Low Risk  (05/18/2022)   Received from [facility]   Overall Financial Resource Strain (CARDIA)    Difficulty of Paying Living Expenses: Not hard at all  Food Insecurity: No Food Insecurity (10/05/2022)   Hunger Vital Sign    Worried About Running Out of Food in the Last Year: Never true    Ran Out of Food in the Last Year: Never true  Transportation Needs: No Transportation Needs (10/05/2022)   PRAPARE - Administrator, Civil Service (Medical): No    Lack of Transportation (Non-Medical): No  Physical Activity: Sufficiently Active (04/12/2022)   [facility] from [facility]   Exercise Vital Sign    Days of Exercise per Week: 5 days    Minutes of Exercise per Session: 30 min  Stress: No Stress Concern Present  (04/12/2022)   Received from [facility] - Occupational Stress Questionnaire    Feeling of Stress : Not at all  Social Connections: Socially Integrated (04/12/2022)   Received from [facility]   Social Connection and Isolation Panel [NHANES]    Frequency of Communication with Friends and Family: More than three times a week    Frequency of Social Gatherings with Friends and Family: More than three times a week    Attends Religious Services: More than 4 times per year    Active Member of [employer] or Organizations: Yes    Attends Engineer, structural: More than 4 times per year    Marital Status: Married     Family History:  The patient's family history includes CAD in her father; Osteoarthritis in her mother.  ROS:      EKGs/Labs/Other Studies Reviewed:    Studies reviewed were summarized above. The additional studies were reviewed today:  2D echo 10/05/2022: 1. Left ventricular ejection fraction, by estimation, is 35 to 40%. The  left ventricle has moderately decreased function. The left ventricle  demonstrates global hypokinesis, basal wall motion best preserved, unable  to exclude stress cardiomyopathy. Left   ventricular diastolic parameters are consistent with Grade I diastolic  dysfunction (impaired relaxation). The average left ventricular global  longitudinal strain is -9.0 %.   2. Right ventricular systolic function is normal. The right ventricular  size is normal. There is normal pulmonary artery systolic pressure. The  estimated right ventricular systolic pressure is 30.6 mmHg.   3. The mitral valve is normal in structure. No evidence of mitral valve  regurgitation. No evidence of mitral stenosis.   4. The aortic valve has an indeterminant number of cusps. Aortic valve  regurgitation is mild. Aortic valve sclerosis is present, with no evidence  of aortic valve stenosis.   5. The inferior vena cava is normal in  size with greater than 50%  respiratory variability, suggesting right atrial pressure of 3 mmHg.    EKG:  EKG is ordered today.  The EKG ordered today demonstrates NSR, 90 bpm, no acute st/t changes  Recent Labs: 10/04/2022: ALT 11; B Natriuretic Peptide 52.3 10/05/2022: TSH 0.232 10/06/2022: BUN 22; Creatinine, Ser 0.64; Hemoglobin 12.8; Platelets 231; Potassium 4.6; Sodium 140  Recent Lipid Panel    Component Value Date/Time   CHOL 219 (H) 10/05/2022 1734   TRIG 58 10/05/2022 1734   HDL 73 10/05/2022 1734   CHOLHDL 3.0 10/05/2022 1734   VLDL 12 10/05/2022 1734   LDLCALC 134 (H) 10/05/2022 1734    PHYSICAL EXAM:    VS:  BP 120/78 (BP Location: Left Arm, Patient Position: Sitting, Cuff Size: Normal)   Pulse 90   Ht 5\' 4"  (1.626 m)   Wt 117 lb 12.8 oz (53.4 kg)   SpO2 91%   BMI 20.22 kg/m   BMI: Body mass index is 20.22 kg/m.  Physical Exam Vitals reviewed.  Constitutional:      Appearance: She is well-developed.  HENT:     Head: Normocephalic and atraumatic.  Eyes:     General:        Right eye: No discharge.        Left eye: No discharge.  Neck:     Vascular: No JVD.  Cardiovascular:     Rate and Rhythm: Normal rate and regular rhythm.     Pulses:          Posterior tibial pulses are 2+ on the right side and 2+ on the left side.     Heart sounds: Normal heart sounds, S1 normal and S2 normal. Heart sounds not distant. No midsystolic click and no opening snap. No murmur heard.    No friction rub.  Pulmonary:     Effort: Pulmonary effort is normal. No respiratory distress.     Breath sounds: Decreased breath sounds present. No wheezing, rhonchi or rales.  Chest:     Chest wall: No tenderness.  Abdominal:     General: There is no distension.  Musculoskeletal:     Cervical back: Normal range of motion.     Right lower leg: No edema.     Left lower leg: No edema.  Skin:  General: Skin is warm and dry.     Nails: There is no clubbing.  Neurological:      Mental Status: She is alert and oriented to person, place, and time.  Psychiatric:        Speech: Speech normal.        Behavior: Behavior normal.        Thought Content: Thought content normal.        Judgment: Judgment normal.     Wt Readings from Last 3 Encounters:  10/30/22 117 lb 12.8 oz (53.4 kg)  10/04/22 130 lb 1.1 oz (59 kg)  09/04/22 127 lb (57.6 kg)     ASSESSMENT & PLAN:   HFrEF: Etiology unclear at this time, though possibly due to stress-induced cardiomyopathy in the setting of COPD exacerbation.  However, cannot exclude ischemic etiology.  She appears euvolemic and well compensated.  Medication list includes bisoprolol and losartan, though she is unclear if she is taking these at this time.  She will bring her medications to her next visit.  In the setting of her cardiomyopathy and elevated high-sensitivity troponin,  multiple risk factors including she requires ischemic evaluation.  This was planned to be [facility] if she remained admitted, though was ultimately discharged.  We discussed invasive and noninvasive ischemic modalities.  Given her history of severe COPD I do not feel Lexiscan MPI would be appropriate at this time.  We have agreed to proceed with [facility].  Escalate GDMT as able and indicated moving forward.  Not currently requiring a standing loop diuretic.  Elevated high-sensitivity troponin: Continue aspirin and atorvastatin.  Plan for cardiac cath as outlined above.  Chronic hypoxic respiratory failure with COPD: Needs to establish with pulmonology.    Informed Consent   Shared Decision Making/Informed Consent{  The risks [stroke (1 in 1000), death (1 in 1000), kidney failure [usually temporary] (1 in 500), bleeding (1 in 200), allergic reaction [possibly serious] (1 in 200)], benefits (diagnostic support and management of coronary artery disease) and alternatives of a cardiac catheterization were discussed in detail with Madison Frank and she is willing to  proceed.        Disposition: F/u with Dr. Kirke Corin or an APP 1 to 2 weeks after cath.   Medication Adjustments/Labs and Tests Ordered: Current medicines are reviewed at length with the patient today.  Concerns regarding medicines are outlined above. Medication changes, Labs and Tests ordered today are summarized above and listed in the Patient Instructions accessible in Encounters.   Signed, Eula Listen, PA-C 10/30/2022 4:55 PM     [facility] [place] [address] [place], [place] [address] [phone]

## 2022-10-30 NOTE — Patient Instructions (Addendum)
Medication Instructions:  Your Physician recommend you continue on your current medication as directed.    *If you need a refill on your cardiac medications before your next appointment, please call your pharmacy*   Lab Work: Your provider would like for you to have following labs drawn today BMeT and CBC.   If you have labs (blood work) drawn today and your tests are completely normal, you will receive your results only by: MyChart Message (if you have MyChart) OR A paper copy in the mail If you have any lab test that is abnormal or we need to change your treatment, we will call you to review the results.   Testing/Procedures:  [place] [facility] DEPT OF [facility] [place] [address] [place] [address] Dept: [phone] Loc: [phone]  Madison Frank  10/30/2022  You are scheduled for a Right and Left Cardiac Catheterization on Friday, September 20 with Dr. Lorine Bears.  1. Please arrive at the [facility] Entrance of [facility], [address], [place] [address] at 8:30 AM (This is 1 hour prior to your procedure time).  Proceed to the Check-In Desk directly inside the entrance.  Procedure Parking: Use the entrance off of the [address] side of the hospital. Turn right upon entering and follow the driveway to parking that is directly in front of the Heart [facility] Vascular Center. There is no valet parking available at this entrance, however there is an awning directly in front of the Heart & Vascular Center for drop off/ pick up for patients.  Special note: Every effort is made to have your procedure done on time. Please understand that emergencies sometimes delay scheduled procedures.  2. Diet: Do not eat solid foods after midnight.  The patient may have clear liquids until 5am upon the day of the procedure.  3. Labs: You will have blood drawn today.  4. Medication instructions in  preparation for your procedure:  On the morning of your procedure, take a  Aspirin 81 mg and any morning medicines you normally take.  You may use sips of water.  5. Plan to go home the same day, you will only stay overnight if medically necessary. 6. Bring a current list of your medications and current insurance cards. 7. You MUST have a responsible person to drive you home. 8. Someone MUST be with you the first 24 hours after you arrive home or your discharge will be delayed. 9. Please wear clothes that are easy to get on and off and wear slip-on shoes.  Thank you for allowing [place] to care for you!   -- [facility]    Follow-Up: At [facility], you and your health needs are our priority.  As part of our continuing mission to provide you with exceptional heart care, we have created designated Provider Care Teams.  These Care Teams include your primary Cardiologist (physician) and Advanced Practice Providers (APPs -  Physician Assistants and Nurse Practitioners) who all work together to provide you with the care you need, when you need it.  We recommend signing up for the patient portal called "MyChart".  Sign up information is provided on this After Visit Summary.  MyChart is used to connect with patients for Virtual Visits (Telemedicine).  Patients are able to view lab/test results, encounter notes, upcoming appointments, etc.  Non-urgent messages can be sent to your provider as well.  To learn more about what you can do with MyChart, go to [email or web address].    Your next appointment:   1 to 2 week(s) after your cath on 20 Sept, 2024  Provider:   You may see Lorine Bears, MD or one of the following Advanced Practice Providers on your designated Care Team:   Eula Listen, [place]

## 2022-10-31 ENCOUNTER — Other Ambulatory Visit: Payer: Self-pay | Admitting: Physician Assistant

## 2022-10-31 LAB — CBC
Hematocrit: 41.5 % (ref 34.0–46.6)
Hemoglobin: 13.6 g/dL (ref 11.1–15.9)
MCH: 32.2 pg (ref 26.6–33.0)
MCHC: 32.8 g/dL (ref 31.5–35.7)
MCV: 98 fL — ABNORMAL HIGH (ref 79–97)
Platelets: 222 10*3/uL (ref 150–450)
RBC: 4.23 x10E6/uL (ref 3.77–5.28)
RDW: 12.2 % (ref 11.7–15.4)
WBC: 5.6 10*3/uL (ref 3.4–10.8)

## 2022-10-31 LAB — BASIC METABOLIC PANEL
BUN/Creatinine Ratio: 23 (ref 12–28)
BUN: 15 mg/dL (ref 8–27)
CO2: 25 mmol/L (ref 20–29)
Calcium: 9 mg/dL (ref 8.7–10.3)
Chloride: 103 mmol/L (ref 96–106)
Creatinine, Ser: 0.66 mg/dL (ref 0.57–1.00)
Glucose: 85 mg/dL (ref 70–99)
Potassium: 4 mmol/L (ref 3.5–5.2)
Sodium: 142 mmol/L (ref 134–144)
eGFR: 91 mL/min/{1.73_m2} (ref >59)

## 2022-10-31 NOTE — Progress Notes (Signed)
Orders for [facility] signed and held

## 2022-11-01 ENCOUNTER — Telehealth: Payer: Self-pay | Admitting: Physician Assistant

## 2022-11-01 NOTE — Telephone Encounter (Signed)
Patient returned call for her lab results. °

## 2022-11-01 NOTE — Telephone Encounter (Signed)
Spoke to patient and informed her of the provider's results from recent labs as follows:  "Precath labs stable"  Patient understood with read back

## 2022-11-09 ENCOUNTER — Telehealth: Payer: Self-pay | Admitting: *Deleted

## 2022-11-09 NOTE — Telephone Encounter (Signed)
No answer/Voicemail box is full.

## 2022-11-09 NOTE — Telephone Encounter (Signed)
Left voicemail message on home number to give [place] a call back so we can review information for her procedure tomorrow.

## 2022-11-10 ENCOUNTER — Encounter: Admission: RE | Payer: Self-pay | Source: Home / Self Care

## 2022-11-10 ENCOUNTER — Ambulatory Visit: Admission: RE | Admit: 2022-11-10 | Payer: 59 | Source: Home / Self Care | Admitting: Cardiovascular Disease

## 2022-11-10 DIAGNOSIS — I5020 Unspecified systolic (congestive) heart failure: Secondary | ICD-10-CM

## 2022-11-10 SURGERY — RIGHT/LEFT HEART CATH AND CORONARY ANGIOGRAPHY
Anesthesia: Moderate Sedation | Laterality: Bilateral

## 2022-11-10 NOTE — Telephone Encounter (Signed)
Attempted to reach the patient times 2. Was unable to leave a message.

## 2022-11-10 NOTE — Telephone Encounter (Signed)
Patient returned RN's call and stated she is not feeling well, her asthma is acting up and wants to cancel procedure scheduled for today.

## 2022-11-13 NOTE — Progress Notes (Deleted)
Cardiology Office Note    Date:  11/13/2022   ID:  Madison Frank, DOB 09/02/1945, MRN [id number]  PCP:  Pcp, No  Cardiologist:  Lorine Bears, MD  Electrophysiologist:  None   Chief Complaint: Follow-up  History of Present Illness:   Madison Frank is a 77 y.o. female with history of chronic hypoxic and hypercapnic respiratory failure on supplemental oxygen at 3 L at baseline, HFrEF, COPD with frequent exacerbations, sleep apnea, migraine, osteoarthritis, and GERD who was recently admitted to [facility] for acute on chronic hypoxic respiratory failure in the setting of COPD exacerbation in 09/2022, and presents for ***.   She has frequent ED visits/admissions for COPD exacerbations. Echo through [facility] [place] in 11/2021 (during COPD exacerbation visit) showed an EF of 55-60%, mild mitral regurgitation, trivial tricuspid regurgitation, and an estimated PASP of 33 mmHg.  She was admitted in 09/2022 with acute on chronic hypoxic and hypercapnic respiratory failure in the setting of COPD exacerbation briefly requiring BiPAP.  High-sensitivity troponin 22 with a delta and peak troponin 164.  BNP 52.  Chest x-ray showed chronic lung disease without superimposed focal airspace opacity.  EKG showed sinus tachycardia, 102 bpm, with baseline artifact and nonspecific ST-T changes.  Echo showed an EF of 35-40% with global hypokinesis with basal wall motion best preserved (unable to exclude stress-induced cardiomyopathy), Gr1DD, normal RV systolic function and ventricular cavity size, normal PASP, aortic valve sclerosis without stenosis, and an estimated right atrial pressure of 3 mmHg.  In this setting, she was evaluated by cardiology with mildly reduced LV systolic function felt to be consistent with stress-induced cardiomyopathy in the setting of COPD exacerbation.  Outpatient ischemic evaluation was recommended once she had improved from a pulmonary status.  She was seen in hospital follow-up on 10/30/2022 and was without  symptoms of angina or cardiac decompensation.  It was unclear what medication she was taking at that time.  In the setting of her cardiomyopathy, elevated high-sensitivity troponin, and multiple risk factors she was scheduled for [facility].  However, this had to be canceled due to patient illness.  ***   Labs independently reviewed: 10/2022 - Hgb 13.6, PLT 222, BUN 15, serum creatinine 0.66, potassium 4.0 09/2022 - TSH 0.232, free T4 low at 0.58 with TC 219, TG 58, HDL 73, LDL 134, A1c 6.2, albumin 3.8, AST/ALT normal   Past Medical History:  Diagnosis Date   Acute bronchitis    Arthritis    Asthma    Cataracts, bilateral    immature   COPD (chronic obstructive pulmonary disease) (HCC)    uses Albuterol as needed;emphysema   GERD (gastroesophageal reflux disease)    but doesn't take any meds   Heart murmur    as a child   History of migraine    last one 03/11/13   History of shingles    Insomnia    occasionally but doesn' take any meds    Joint pain    Joint swelling    Nocturia    On home O2    @ 2L/m via N/C   Shortness of breath    with exertion   Sleep apnea    CPAP;sleep study done several yrs ago   Urinary frequency     Past Surgical History:  Procedure Laterality Date   ABDOMINAL HYSTERECTOMY     TOTAL KNEE ARTHROPLASTY Left 03/17/2013   Procedure: LEFT TOTAL KNEE ARTHROPLASTY;  Surgeon: Dannielle Huh, MD;  Location: [facility] OR;  Service: Orthopedics;  Laterality: Left;  Current Medications: No outpatient medications have been marked as taking for the 11/17/22 encounter (Appointment) with Sondra Barges, PA-C.    Allergies:   Patient has no known allergies.   Social History   Socioeconomic History   Marital status: Married    Spouse name: Not on file   Number of children: Not on file   Years of education: Not on file   Highest education level: Not on file  Occupational History   Not on file  Tobacco Use   Smoking status: Former   Smokeless tobacco: Not on file   Substance and Sexual Activity   Alcohol use: No   Drug use: No   Sexual activity: Yes    Birth control/protection: Surgical  Other Topics Concern   Not on file  Social History Narrative   Not on file   Social Determinants of Health   Financial Resource Strain: Low Risk  (05/18/2022)   Received from [facility]   Overall Financial Resource Strain (CARDIA)    Difficulty of Paying Living Expenses: Not hard at all  Food Insecurity: No Food Insecurity (10/05/2022)   Hunger Vital Sign    Worried About Running Out of Food in the Last Year: Never true    Ran Out of Food in the Last Year: Never true  Transportation Needs: No Transportation Needs (10/05/2022)   PRAPARE - Administrator, Civil Service (Medical): No    Lack of Transportation (Non-Medical): No  Physical Activity: Sufficiently Active (04/12/2022)   Received from [facility]   Exercise Vital Sign    Days of Exercise per Week: 5 days    Minutes of Exercise per Session: 30 min  Stress: No Stress Concern Present (04/12/2022)   Received from [facility] of Occupational Health - Occupational Stress Questionnaire    Feeling of Stress : Not at all  Social Connections: Socially Integrated (04/12/2022)   Received from [facility]   Social Connection and Isolation Panel [NHANES]    Frequency of Communication with Friends and Family: More than three times a week    Frequency of Social Gatherings with Friends and Family: More than three times a week    Attends Religious Services: More than 4 times per year    Active Member of [employer] or Organizations: Yes    Attends Engineer, structural: More than 4 times per year    Marital Status: Married     Family History:  The patient's family history includes CAD in her father; Osteoarthritis in her mother.  ROS:   12-point review of systems is negative unless otherwise noted in the HPI.   EKGs/Labs/Other Studies Reviewed:    Studies  reviewed were summarized above. The additional studies were reviewed today:  2D echo 10/05/2022: 1. Left ventricular ejection fraction, by estimation, is 35 to 40%. The  left ventricle has moderately decreased function. The left ventricle  demonstrates global hypokinesis, basal wall motion best preserved, unable  to exclude stress cardiomyopathy. Left   ventricular diastolic parameters are consistent with Grade I diastolic  dysfunction (impaired relaxation). The average left ventricular global  longitudinal strain is -9.0 %.   2. Right ventricular systolic function is normal. The right ventricular  size is normal. There is normal pulmonary artery systolic pressure. The  estimated right ventricular systolic pressure is 30.6 mmHg.   3. The mitral valve is normal in structure. No evidence of mitral valve  regurgitation. No evidence of mitral  stenosis.   4. The aortic valve has an indeterminant number of cusps. Aortic valve  regurgitation is mild. Aortic valve sclerosis is present, with no evidence  of aortic valve stenosis.   5. The inferior vena cava is normal in size with greater than 50%  respiratory variability, suggesting right atrial pressure of 3 mmHg.    EKG:  EKG is ordered today.  The EKG ordered today demonstrates ***  Recent Labs: 10/04/2022: ALT 11; B Natriuretic Peptide 52.3 10/05/2022: TSH 0.232 10/30/2022: BUN 15; Creatinine, Ser 0.66; Hemoglobin 13.6; Platelets 222; Potassium 4.0; Sodium 142  Recent Lipid Panel    Component Value Date/Time   CHOL 219 (H) 10/05/2022 1734   TRIG 58 10/05/2022 1734   HDL 73 10/05/2022 1734   CHOLHDL 3.0 10/05/2022 1734   VLDL 12 10/05/2022 1734   LDLCALC 134 (H) 10/05/2022 1734    PHYSICAL EXAM:    VS:  There were no vitals taken for this visit.  BMI: There is no height or weight on file to calculate BMI.  Physical Exam  Wt Readings from Last 3 Encounters:  10/30/22 117 lb 12.8 oz (53.4 kg)  10/04/22 130 lb 1.1 oz (59 kg)   09/04/22 127 lb (57.6 kg)     ASSESSMENT & PLAN:   HFrEF:  Elevated high-sensitivity troponin:  Chronic hypoxic respiratory failure with COPD:   {Are you ordering a CV Procedure (e.g. stress test, cath, DCCV, TEE, etc)?   Press F2        :[id number]}     Disposition: F/u with Dr. Kirke Corin or an APP in ***.   Medication Adjustments/Labs and Tests Ordered: Current medicines are reviewed at length with the patient today.  Concerns regarding medicines are outlined above. Medication changes, Labs and Tests ordered today are summarized above and listed in the Patient Instructions accessible in Encounters.   Signed, Eula Listen, PA-C 11/13/2022 4:16 PM     [facility] [place] [address] [place], [place] [address] [phone]

## 2022-11-15 ENCOUNTER — Ambulatory Visit: Payer: 59 | Admitting: Nurse Practitioner

## 2022-11-16 ENCOUNTER — Telehealth: Payer: Self-pay | Admitting: Internal Medicine

## 2022-11-16 NOTE — Telephone Encounter (Signed)
Attempted to return call to r/s new appointment. No answer on either phone, unable to leave [facility]

## 2022-11-17 ENCOUNTER — Ambulatory Visit: Payer: 59 | Admitting: Physician Assistant

## 2022-11-17 NOTE — Telephone Encounter (Signed)
Patient canceled appointment for 9/27 and rescheduled for 10/18.

## 2022-11-21 ENCOUNTER — Other Ambulatory Visit: Payer: Self-pay

## 2022-11-21 ENCOUNTER — Encounter: Payer: Self-pay | Admitting: Family Medicine

## 2022-11-21 ENCOUNTER — Emergency Department: Payer: 59

## 2022-11-21 ENCOUNTER — Inpatient Hospital Stay
Admission: EM | Admit: 2022-11-21 | Discharge: 2022-11-24 | DRG: 190 | Disposition: A | Discharge status: Home Health | Payer: 59 | Attending: Internal Medicine | Admitting: Internal Medicine

## 2022-11-21 DIAGNOSIS — J441 Chronic obstructive pulmonary disease with (acute) exacerbation: Principal | ICD-10-CM | POA: Diagnosis present

## 2022-11-21 DIAGNOSIS — Z8619 Personal history of other infectious and parasitic diseases: Secondary | ICD-10-CM

## 2022-11-21 DIAGNOSIS — E785 Hyperlipidemia, unspecified: Secondary | ICD-10-CM | POA: Insufficient documentation

## 2022-11-21 DIAGNOSIS — R059 Cough, unspecified: Secondary | ICD-10-CM | POA: Diagnosis not present

## 2022-11-21 DIAGNOSIS — E8729 Other acidosis: Secondary | ICD-10-CM | POA: Diagnosis present

## 2022-11-21 DIAGNOSIS — I493 Ventricular premature depolarization: Secondary | ICD-10-CM | POA: Diagnosis present

## 2022-11-21 DIAGNOSIS — K219 Gastro-esophageal reflux disease without esophagitis: Secondary | ICD-10-CM

## 2022-11-21 DIAGNOSIS — R5381 Other malaise: Secondary | ICD-10-CM | POA: Diagnosis present

## 2022-11-21 DIAGNOSIS — I10 Essential (primary) hypertension: Secondary | ICD-10-CM | POA: Insufficient documentation

## 2022-11-21 DIAGNOSIS — J849 Interstitial pulmonary disease, unspecified: Secondary | ICD-10-CM | POA: Diagnosis present

## 2022-11-21 DIAGNOSIS — J9622 Acute and chronic respiratory failure with hypercapnia: Secondary | ICD-10-CM | POA: Diagnosis present

## 2022-11-21 DIAGNOSIS — Z8249 Family history of ischemic heart disease and other diseases of the circulatory system: Secondary | ICD-10-CM

## 2022-11-21 DIAGNOSIS — Z9981 Dependence on supplemental oxygen: Secondary | ICD-10-CM

## 2022-11-21 DIAGNOSIS — Z79899 Other long term (current) drug therapy: Secondary | ICD-10-CM | POA: Diagnosis not present

## 2022-11-21 DIAGNOSIS — Z87891 Personal history of nicotine dependence: Secondary | ICD-10-CM

## 2022-11-21 DIAGNOSIS — Z96652 Presence of left artificial knee joint: Secondary | ICD-10-CM | POA: Diagnosis present

## 2022-11-21 DIAGNOSIS — J9621 Acute and chronic respiratory failure with hypoxia: Secondary | ICD-10-CM | POA: Diagnosis present

## 2022-11-21 DIAGNOSIS — R0602 Shortness of breath: Secondary | ICD-10-CM | POA: Diagnosis not present

## 2022-11-21 DIAGNOSIS — Z90710 Acquired absence of both cervix and uterus: Secondary | ICD-10-CM

## 2022-11-21 DIAGNOSIS — G4730 Sleep apnea, unspecified: Secondary | ICD-10-CM | POA: Diagnosis present

## 2022-11-21 DIAGNOSIS — Z7951 Long term (current) use of inhaled steroids: Secondary | ICD-10-CM | POA: Diagnosis not present

## 2022-11-21 DIAGNOSIS — Z1152 Encounter for screening for COVID-19: Secondary | ICD-10-CM

## 2022-11-21 LAB — CBC WITH DIFFERENTIAL/PLATELET
Abs Immature Granulocytes: 0.02 10*3/uL (ref 0.00–0.07)
Basophils Absolute: 0.1 10*3/uL (ref 0.0–0.1)
Basophils Relative: 1 %
Eosinophils Absolute: 0.6 10*3/uL — ABNORMAL HIGH (ref 0.0–0.5)
Eosinophils Relative: 13 %
HCT: 43.5 % (ref 36.0–46.0)
Hemoglobin: 13.9 g/dL (ref 12.0–15.0)
Immature Granulocytes: 1 %
Lymphocytes Relative: 25 %
Lymphs Abs: 1.1 10*3/uL (ref 0.7–4.0)
MCH: 31.6 pg (ref 26.0–34.0)
MCHC: 32 g/dL (ref 30.0–36.0)
MCV: 98.9 fL (ref 80.0–100.0)
Monocytes Absolute: 0.4 10*3/uL (ref 0.1–1.0)
Monocytes Relative: 9 %
Neutro Abs: 2.1 10*3/uL (ref 1.7–7.7)
Neutrophils Relative %: 51 %
Platelets: 203 10*3/uL (ref 150–400)
RBC: 4.4 MIL/uL (ref 3.87–5.11)
RDW: 12.8 % (ref 11.5–15.5)
WBC: 4.2 10*3/uL (ref 4.0–10.5)
nRBC: 0 % (ref 0.0–0.2)

## 2022-11-21 LAB — COMPREHENSIVE METABOLIC PANEL
ALT: 14 U/L (ref 0–44)
AST: 23 U/L (ref 15–41)
Albumin: 3.8 g/dL (ref 3.5–5.0)
Alkaline Phosphatase: 51 U/L (ref 38–126)
Anion gap: 7 (ref 5–15)
BUN: 16 mg/dL (ref 8–23)
CO2: 30 mmol/L (ref 22–32)
Calcium: 8.7 mg/dL — ABNORMAL LOW (ref 8.9–10.3)
Chloride: 103 mmol/L (ref 98–111)
Creatinine, Ser: 0.52 mg/dL (ref 0.44–1.00)
GFR, Estimated: >60 mL/min (ref >60)
Glucose, Bld: 103 mg/dL — ABNORMAL HIGH (ref 70–99)
Potassium: 3.7 mmol/L (ref 3.5–5.1)
Sodium: 140 mmol/L (ref 135–145)
Total Bilirubin: 0.4 mg/dL (ref 0.3–1.2)
Total Protein: 6.8 g/dL (ref 6.5–8.1)

## 2022-11-21 LAB — BLOOD GAS, VENOUS
Acid-Base Excess: 5.5 mmol/L — ABNORMAL HIGH (ref 0.0–2.0)
Bicarbonate: 34.6 mmol/L — ABNORMAL HIGH (ref 20.0–28.0)
O2 Saturation: 48 %
Patient temperature: 37
pCO2, Ven: 72 mm[Hg] (ref 44–60)
pH, Ven: 7.29 (ref 7.25–7.43)
pO2, Ven: 33 mm[Hg] (ref 32–45)

## 2022-11-21 LAB — SARS CORONAVIRUS 2 BY RT PCR: SARS Coronavirus 2 by RT PCR: NEGATIVE

## 2022-11-21 LAB — BRAIN NATRIURETIC PEPTIDE: B Natriuretic Peptide: 81.1 pg/mL (ref 0.0–100.0)

## 2022-11-21 LAB — TROPONIN I (HIGH SENSITIVITY)
Troponin I (High Sensitivity): 7 ng/L (ref <18)
Troponin I (High Sensitivity): 8 ng/L (ref <18)

## 2022-11-21 LAB — MRSA NEXT GEN BY PCR, NASAL: MRSA by PCR Next Gen: NOT DETECTED

## 2022-11-21 LAB — GLUCOSE, CAPILLARY: Glucose-Capillary: 93 mg/dL (ref 70–99)

## 2022-11-21 MED ORDER — IPRATROPIUM-ALBUTEROL 0.5-2.5 (3) MG/3ML IN SOLN
3.0000 mL | Freq: Once | RESPIRATORY_TRACT | Status: AC
  Administered 2022-11-21: 3 mL via RESPIRATORY_TRACT
  Filled 2022-11-21: qty 3

## 2022-11-21 MED ORDER — BENZONATATE 100 MG PO CAPS
200.0000 mg | ORAL_CAPSULE | Freq: Three times a day (TID) | ORAL | Status: DC | PRN
  Administered 2022-11-21 – 2022-11-22 (×3): 200 mg via ORAL
  Filled 2022-11-21 (×3): qty 2

## 2022-11-21 MED ORDER — ATORVASTATIN CALCIUM 20 MG PO TABS
80.0000 mg | ORAL_TABLET | Freq: Every day | ORAL | Status: DC
  Administered 2022-11-22 – 2022-11-24 (×3): 80 mg via ORAL
  Filled 2022-11-21 (×3): qty 4

## 2022-11-21 MED ORDER — SODIUM CHLORIDE 0.9 % IV SOLN: INTRAVENOUS | Status: DC

## 2022-11-21 MED ORDER — PREDNISONE 20 MG PO TABS
40.0000 mg | ORAL_TABLET | Freq: Every day | ORAL | Status: DC
  Administered 2022-11-23: 40 mg via ORAL
  Filled 2022-11-21: qty 2

## 2022-11-21 MED ORDER — ONDANSETRON HCL 4 MG/2ML IJ SOLN: 4.0000 mg | Freq: Four times a day (QID) | INTRAMUSCULAR | Status: DC | PRN

## 2022-11-21 MED ORDER — ALBUTEROL SULFATE (2.5 MG/3ML) 0.083% IN NEBU: 2.5000 mg | INHALATION_SOLUTION | RESPIRATORY_TRACT | Status: DC | PRN

## 2022-11-21 MED ORDER — METHYLPREDNISOLONE SODIUM SUCC 40 MG IJ SOLR
40.0000 mg | Freq: Two times a day (BID) | INTRAMUSCULAR | Status: AC
  Administered 2022-11-21 – 2022-11-22 (×2): 40 mg via INTRAVENOUS
  Filled 2022-11-21 (×2): qty 1

## 2022-11-21 MED ORDER — METHYLPREDNISOLONE SODIUM SUCC 125 MG IJ SOLR
125.0000 mg | Freq: Once | INTRAMUSCULAR | Status: AC
  Administered 2022-11-21: 125 mg via INTRAVENOUS
  Filled 2022-11-21: qty 2

## 2022-11-21 MED ORDER — IPRATROPIUM-ALBUTEROL 0.5-2.5 (3) MG/3ML IN SOLN
3.0000 mL | Freq: Four times a day (QID) | RESPIRATORY_TRACT | Status: DC
  Administered 2022-11-21 – 2022-11-23 (×7): 3 mL via RESPIRATORY_TRACT
  Filled 2022-11-21 (×7): qty 3

## 2022-11-21 MED ORDER — BISOPROLOL FUMARATE 5 MG PO TABS
2.5000 mg | ORAL_TABLET | Freq: Every day | ORAL | Status: DC
  Administered 2022-11-22 – 2022-11-24 (×3): 2.5 mg via ORAL
  Filled 2022-11-21 (×3): qty 0.5

## 2022-11-21 MED ORDER — ENOXAPARIN SODIUM 40 MG/0.4ML IJ SOSY
40.0000 mg | PREFILLED_SYRINGE | INTRAMUSCULAR | Status: DC
  Administered 2022-11-21 – 2022-11-23 (×3): 40 mg via SUBCUTANEOUS
  Filled 2022-11-21 (×3): qty 0.4

## 2022-11-21 MED ORDER — SODIUM CHLORIDE 0.9 % IV SOLN
1.0000 g | INTRAVENOUS | Status: DC
  Administered 2022-11-22 – 2022-11-23 (×2): 1 g via INTRAVENOUS
  Filled 2022-11-21 (×2): qty 10

## 2022-11-21 MED ORDER — GUAIFENESIN-DM 100-10 MG/5ML PO SYRP
5.0000 mL | ORAL_SOLUTION | ORAL | Status: DC | PRN
  Administered 2022-11-22 – 2022-11-23 (×2): 5 mL via ORAL
  Filled 2022-11-21 (×2): qty 10

## 2022-11-21 MED ORDER — LOSARTAN POTASSIUM 25 MG PO TABS
12.5000 mg | ORAL_TABLET | Freq: Every day | ORAL | Status: DC
  Administered 2022-11-22 – 2022-11-24 (×3): 12.5 mg via ORAL
  Filled 2022-11-21 (×2): qty 1
  Filled 2022-11-21: qty 0.5

## 2022-11-21 MED ORDER — ONDANSETRON HCL 4 MG PO TABS: 4.0000 mg | ORAL_TABLET | Freq: Four times a day (QID) | ORAL | Status: DC | PRN

## 2022-11-21 NOTE — Assessment & Plan Note (Signed)
Positive COPD exacerbation now requiring BiPAP IV Solu-Medrol DuoNebs IV Rocephin Continue NIPPV and supplemental oxygen as needed Follow

## 2022-11-21 NOTE — H&P (Addendum)
History and Physical    Patient: Madison Frank EAV:[id number] DOB: 10-09-1945 DOA: 11/21/2022 DOS: the patient was seen and examined on 11/21/2022 PCP: Pcp, No  Patient coming from: Home  Chief Complaint:  Chief Complaint  Patient presents with   Shortness of Breath   HPI: Madison Frank is a 77 y.o. female with medical history significant of COPD, GERD, hypertension, hyperlipidemia, chronic respiratory failure on 3 L presenting with acute on chronic respiratory failure with hypoxia, COPD exacerbation.  Patient reports increased work of breathing over the past 2 to 3 days.  Positive cough, wheezing, increased sputum production.  No reported sick contacts.  Patient with overall worsening malaise.  No longer smoking.  Was using home inhalers with minimal improvement in symptoms.  Increased oxygen from 3 to 4 L with still persistent symptoms.  No reported chest pain.  No reported nausea or vomiting.  No focal hemiparesis or confusion. Presented to the ER afebrile, hemodynamically stable.  Satting 78% on room air, then 88% on 4 L, transition to BiPAP.  White count 4.2, hemoglobin 13.9, platelets 203, creatinine 0.52, troponin negative x 2.  VBG with compensated respiratory acidosis.  Chest x-ray with chronic interstitial lung disease. Review of Systems: As mentioned in the history of present illness. All other systems reviewed and are negative. Past Medical History:  Diagnosis Date   Acute bronchitis    Arthritis    Asthma    Cataracts, bilateral    immature   COPD (chronic obstructive pulmonary disease) (HCC)    uses Albuterol as needed;emphysema   GERD (gastroesophageal reflux disease)    but doesn't take any meds   Heart murmur    as a child   History of migraine    last one 03/11/13   History of shingles    Insomnia    occasionally but doesn' take any meds    Joint pain    Joint swelling    Nocturia    On home O2    @ 2L/m via N/C   Shortness of breath    with exertion    Sleep apnea    CPAP;sleep study done several yrs ago   Urinary frequency    Past Surgical History:  Procedure Laterality Date   ABDOMINAL HYSTERECTOMY     TOTAL KNEE ARTHROPLASTY Left 03/17/2013   Procedure: LEFT TOTAL KNEE ARTHROPLASTY;  Surgeon: Dannielle Huh, MD;  Location: [facility] OR;  Service: Orthopedics;  Laterality: Left;   Social History:  reports that she has quit smoking. She does not have any smokeless tobacco history on file. She reports that she does not drink alcohol and does not use drugs.  No Known Allergies  Family History  Problem Relation Age of Onset   Osteoarthritis Mother    CAD Father     Prior to Admission medications   Medication Sig Start Date End Date Taking? Authorizing Provider  acetaminophen (TYLENOL) 325 MG tablet Take 2 tablets (650 mg total) by mouth every 6 (six) hours as needed for mild pain (or Fever >/= 101). 10/05/22   Loyce Dys, MD  albuterol (VENTOLIN HFA) 108 (90 Base) MCG/ACT inhaler Inhale into the lungs. 02/12/22   [provider]  aspirin 325 MG tablet Take 325 mg by mouth every 6 (six) hours as needed for mild pain.    [provider]  atorvastatin (LIPITOR) 80 MG tablet Take 1 tablet (80 mg total) by mouth daily. 10/07/22   Kirstie Peri, MD  bisoprolol (ZEBETA) 5 MG  tablet Take 0.5 tablets (2.5 mg total) by mouth daily. 10/06/22   Kirstie Peri, MD  fluticasone furoate-vilanterol (BREO ELLIPTA) 200-25 MCG/ACT AEPB Inhale 1 puff into the lungs daily. 06/21/22 12/18/22  [provider]  ipratropium-albuterol (DUONEB) 0.5-2.5 (3) MG/3ML SOLN Inhale into the lungs.    [provider]  losartan (COZAAR) 25 MG tablet Take 0.5 tablets (12.5 mg total) by mouth daily. 10/07/22   Kirstie Peri, MD    Physical Exam: Vitals:   11/21/22 0930 11/21/22 1000 11/21/22 1100 11/21/22 1200  BP: (!) 143/53 (!) 136/55 (!) 131/48 124/62  Pulse: 76 87 63 81  Resp: 17 (!) 22 20 (!) 23  Temp:  97.6 F (36.4 C) 98.2 F (36.8  C) 98.4 F (36.9 C)  TempSrc:  Axillary Oral Oral  SpO2: 92% 95% 93% 94%  Weight:      Height:       Physical Exam Constitutional:      Appearance: She is normal weight.     Comments: Mild increased work of breathing  HENT:     Head: Normocephalic.     Nose: Nose normal.     Mouth/Throat:     Mouth: Mucous membranes are moist.  Cardiovascular:     Rate and Rhythm: Normal rate and regular rhythm.  Pulmonary:     Breath sounds: Wheezing present.     Comments: Mild increased work of breathing Abdominal:     General: Bowel sounds are normal.  Musculoskeletal:        General: Normal range of motion.  Skin:    General: Skin is warm.  Neurological:     General: No focal deficit present.  Psychiatric:        Mood and Affect: Mood normal.     Data Reviewed:  There are no new results to review at this time.  DG Chest Portable 1 View CLINICAL DATA:  sob.  Cough.  EXAM: PORTABLE CHEST 1 VIEW  COMPARISON:  10/04/2022.  FINDINGS: Redemonstration of increased bilateral interstitial markings, similar to the prior study. No overt pulmonary edema. Bilateral lungs otherwise clear. No dense consolidation or major lung collapse. Bilateral lateral costophrenic angles are clear.  Normal cardio-mediastinal silhouette.  No acute osseous abnormalities.  The soft tissues are within normal limits.  IMPRESSION: No acute cardiopulmonary process. Chronic interstitial lung disease.  Electronically Signed   By: Jules Schick M.D.   On: 11/21/2022 08:20  Lab Results  Component Value Date   WBC 4.2 11/21/2022   HGB 13.9 11/21/2022   HCT 43.5 11/21/2022   MCV 98.9 11/21/2022   PLT 203 11/21/2022   Last metabolic panel Lab Results  Component Value Date   GLUCOSE 103 (H) 11/21/2022   NA 140 11/21/2022   K 3.7 11/21/2022   CL 103 11/21/2022   CO2 30 11/21/2022   BUN 16 11/21/2022   CREATININE 0.52 11/21/2022   GFRNONAA >60 11/21/2022   CALCIUM 8.7 (L) 11/21/2022   PROT  6.8 11/21/2022   ALBUMIN 3.8 11/21/2022   BILITOT 0.4 11/21/2022   ALKPHOS 51 11/21/2022   AST 23 11/21/2022   ALT 14 11/21/2022   ANIONGAP 7 11/21/2022    Assessment and Plan: * Acute on chronic respiratory failure with hypoxia (HCC) Decompensated respiratory failure from baseline 3 L now requiring BiPAP in the setting of active COPD exacerbation Chest x-ray with chronic interstitial lung disease Marked diffuse wheezing on exam IV Solu-Medrol DuoNebs IV Rocephin Continue NIPPV as clinically indicated     COPD  exacerbation (HCC) Positive COPD exacerbation now requiring BiPAP IV Solu-Medrol DuoNebs IV Rocephin Continue NIPPV and supplemental oxygen as needed Follow  HLD (hyperlipidemia) Statin   HTN (hypertension) BP stable  Titrate home regimen     Greater than 50% was spent in counseling and coordination of care with patient Total encounter time 80 minutes or more    Advance Care Planning:   Code Status: Full Code   Consults: None   Family Communication: No family at the bedside   Severity of Illness: The appropriate patient status for this patient is INPATIENT. Inpatient status is judged to be reasonable and necessary in order to provide the required intensity of service to ensure the patient's safety. The patient's presenting symptoms, physical exam findings, and initial radiographic and laboratory data in the context of their chronic comorbidities is felt to place them at high risk for further clinical deterioration. Furthermore, it is not anticipated that the patient will be medically stable for discharge from the hospital within 2 midnights of admission.   * I certify that at the point of admission it is my clinical judgment that the patient will require inpatient hospital care spanning beyond 2 midnights from the point of admission due to high intensity of service, high risk for further deterioration and high frequency of surveillance  required.*  Author: Floydene Flock, MD 11/21/2022 2:06 PM  For on call review [email or web address].

## 2022-11-21 NOTE — ED Notes (Signed)
RN aware of bed assignment 

## 2022-11-21 NOTE — Assessment & Plan Note (Signed)
?   Statin.

## 2022-11-21 NOTE — Assessment & Plan Note (Signed)
BP stable Titrate home regimen 

## 2022-11-21 NOTE — ED Provider Notes (Signed)
[facility] Provider Note    Event Date/Time   First MD Initiated Contact with Patient 11/21/22 [phone]     (approximate)   History   Shortness of Breath   HPI  Madison Frank is a 77 y.o. female  COPD with chronic respiratory failure on home O2 at 3 L.  Patient reports that she does not have a portable oxygen tank therefore patient was transported by her husband here on room air although she is supposed to be on 3 L.  When she got here her sats were in the 70s and patient reports shortness of breath.  She reports shortness of breath over the past 4 days.  She states that it feels like her prior COPD exacerbations.  She does report some intermittent chest pain but denies any pain right now.  She denies any fevers but does report a cough.  Denies any abdominal pain.  Patient reports that she was post to have a catheterization done on 9/20 but she did not have it done due to having some asthma exacerbation.  She denies any history of blood clots, heart attack.  She denies any swelling in her legs.      Physical Exam   Triage Vital Signs: ED Triage Vitals  Encounter Vitals Group     BP 11/21/22 0712 (!) 167/76     Systolic BP Percentile --      Diastolic BP Percentile --      Pulse Rate 11/21/22 0712 88     Resp 11/21/22 0712 (!) 26     Temp 11/21/22 0712 98.5 F (36.9 C)     Temp Source 11/21/22 0712 Oral     SpO2 11/21/22 0712 (!) 78 %     Weight 11/21/22 0711 117 lb 11.6 oz (53.4 kg)     Height 11/21/22 0711 5\' 4"  (1.626 m)     Head Circumference --      Peak Flow --      Pain Score 11/21/22 0711 0     Pain Loc --      Pain Education --      Exclude from Growth Chart --     Most recent vital signs: Vitals:   11/21/22 0712  BP: (!) 167/76  Pulse: 88  Resp: (!) 26  Temp: 98.5 F (36.9 C)  SpO2: (!) 78%     General: Awake, no distress.  CV:  Good peripheral perfusion.  Resp:  Tight air exchange with some mild wheezing noted, increased  work of breathing Abd:  No distention.  Nontender Other:  No swelling in legs.  No calf tenderness   ED Results / Procedures / Treatments   Labs (all labs ordered are listed, but only abnormal results are displayed) Labs Reviewed  SARS CORONAVIRUS 2 BY RT PCR  CBC WITH DIFFERENTIAL/PLATELET  COMPREHENSIVE METABOLIC PANEL  BLOOD GAS, VENOUS  BRAIN NATRIURETIC PEPTIDE  TROPONIN I (HIGH SENSITIVITY)     EKG  My interpretation of EKG:  Sinus tachycardia rate of 100 without any ST elevation or T wave inversions, occasional PVC QTc prolonged at 517  RADIOLOGY I have reviewed the xray personally and interpreted and there is no evidence of any pneumonia does have some signs of chronic interstitial lung disease.  PROCEDURES:  Critical Care performed: Yes, see critical care procedure note(s)  .1-3 Lead EKG Interpretation  Performed by: Concha Se, MD Authorized by: Concha Se, MD     Interpretation: normal  ECG rate:  80   ECG rate assessment: normal     Rhythm: sinus rhythm     Ectopy: PVCs   .Critical Care  Performed by: Concha Se, MD Authorized by: Concha Se, MD   Critical care provider statement:    Critical care time (minutes):  30   Critical care was necessary to treat or prevent imminent or life-threatening deterioration of the following conditions:  Respiratory failure   Critical care was time spent personally by me on the following activities:  Development of treatment plan with patient or surrogate, discussions with consultants, evaluation of patient's response to treatment, examination of patient, ordering and review of laboratory studies, ordering and review of radiographic studies, ordering and performing treatments and interventions, pulse oximetry, re-evaluation of patient's condition and review of old charts    MEDICATIONS ORDERED IN ED: Medications  ipratropium-albuterol (DUONEB) 0.5-2.5 (3) MG/3ML nebulizer solution 3 mL (has no  administration in time range)  ipratropium-albuterol (DUONEB) 0.5-2.5 (3) MG/3ML nebulizer solution 3 mL (has no administration in time range)  ipratropium-albuterol (DUONEB) 0.5-2.5 (3) MG/3ML nebulizer solution 3 mL (has no administration in time range)  methylPREDNISolone sodium succinate (SOLU-MEDROL) 125 mg/2 mL injection 125 mg (has no administration in time range)     IMPRESSION / MDM / ASSESSMENT AND PLAN / ED COURSE  I reviewed the triage vital signs and the nursing notes.   Patient's presentation is most consistent with severe exacerbation of chronic illness.   Patient comes in with history of COPD with shortness of breath initially hypoxic but not on her baseline 3 L.  Patient was placed on 3 L and still satting in the 88% therefore increased to 4 L due to work of breathing.  Workup was done to evaluate for pneumonia, COVID, COPD exacerbation.  Low suspicion for PE at this time given no other signs or symptoms of this and she states that this is consistent with her prior COPD.  EKG and cardiac markers were ordered for concern for possible ACS and patient was placed on cardiac monitor.  Patient was given DuoNebs and steroids to help with COPD exacerbation  8:11 AM CO2 elevated given WOB and elevated CO2 although does look chronic with elevated bicarb will place on Bipap.   Troponin negative.  CBC reassuring.  COVID test negative.  Reevaluated patient looking much more comfortable on BiPAP.  Discussed with them given her elevated CO2 and significantly work of breathing I would recommend admission for COPD exacerbation.  Patient expressed understanding felt comfortable with this plan.  Antibiotics and blood cultures were not ordered at this time given patient is afebrile does not appear septic and her increased respiratory rate and heart rate were most likely secondary to her COPD exacerbation.  The patient is on the cardiac monitor to evaluate for evidence of arrhythmia and/or  significant heart rate changes.      FINAL CLINICAL IMPRESSION(S) / ED DIAGNOSES   Final diagnoses:  COPD exacerbation (HCC)  Acute on chronic respiratory failure with hypoxia and hypercapnia (HCC)     Rx / DC Orders   ED Discharge Orders     None        Note:  This document was prepared using Dragon voice recognition software and may include unintentional dictation errors.   Concha Se, MD 11/21/22 [phone]

## 2022-11-21 NOTE — Assessment & Plan Note (Signed)
Decompensated respiratory failure from baseline 3 L now requiring BiPAP in the setting of active COPD exacerbation Chest x-ray with chronic interstitial lung disease Marked diffuse wheezing on exam IV Solu-Medrol DuoNebs IV Rocephin Continue NIPPV as clinically indicated

## 2022-11-21 NOTE — ED Triage Notes (Signed)
SOB and cough x 1 week.

## 2022-11-22 DIAGNOSIS — J9621 Acute and chronic respiratory failure with hypoxia: Secondary | ICD-10-CM | POA: Diagnosis not present

## 2022-11-22 DIAGNOSIS — J441 Chronic obstructive pulmonary disease with (acute) exacerbation: Secondary | ICD-10-CM | POA: Diagnosis not present

## 2022-11-22 LAB — COMPREHENSIVE METABOLIC PANEL
ALT: 11 U/L (ref 0–44)
AST: 13 U/L — ABNORMAL LOW (ref 15–41)
Albumin: 3.4 g/dL — ABNORMAL LOW (ref 3.5–5.0)
Alkaline Phosphatase: 41 U/L (ref 38–126)
Anion gap: 5 (ref 5–15)
BUN: 18 mg/dL (ref 8–23)
CO2: 30 mmol/L (ref 22–32)
Calcium: 8.9 mg/dL (ref 8.9–10.3)
Chloride: 103 mmol/L (ref 98–111)
Creatinine, Ser: 0.58 mg/dL (ref 0.44–1.00)
GFR, Estimated: >60 mL/min (ref >60)
Glucose, Bld: 135 mg/dL — ABNORMAL HIGH (ref 70–99)
Potassium: 4.4 mmol/L (ref 3.5–5.1)
Sodium: 138 mmol/L (ref 135–145)
Total Bilirubin: 0.2 mg/dL — ABNORMAL LOW (ref 0.3–1.2)
Total Protein: 6.1 g/dL — ABNORMAL LOW (ref 6.5–8.1)

## 2022-11-22 LAB — CBC
HCT: 39.6 % (ref 36.0–46.0)
Hemoglobin: 12.7 g/dL (ref 12.0–15.0)
MCH: 31.8 pg (ref 26.0–34.0)
MCHC: 32.1 g/dL (ref 30.0–36.0)
MCV: 99 fL (ref 80.0–100.0)
Platelets: 209 10*3/uL (ref 150–400)
RBC: 4 MIL/uL (ref 3.87–5.11)
RDW: 12.3 % (ref 11.5–15.5)
WBC: 5.3 10*3/uL (ref 4.0–10.5)
nRBC: 0 % (ref 0.0–0.2)

## 2022-11-22 MED ORDER — CHLORHEXIDINE GLUCONATE CLOTH 2 % EX PADS: 6.0000 | MEDICATED_PAD | Freq: Every day | CUTANEOUS | Status: DC

## 2022-11-22 NOTE — Progress Notes (Signed)
PROGRESS NOTE    Madison Frank  ZOX:[id number] DOB: 1945/04/20 DOA: 11/21/2022 PCP: Pcp, No   Assessment & Plan:   Principal Problem:   Acute on chronic respiratory failure with hypoxia (HCC) Active Problems:   COPD exacerbation (HCC)   HTN (hypertension)   HLD (hyperlipidemia)  Assessment and Plan: Acute on chronic hypoxic respiratory failure: likely secondary to COPD exacerbation. Uses 3L [place] at home. Continue on supplemental oxygen and wean back to baseline as tolerated. Continue on abxs, steroids, bronchodilators   COPD exacerbation: continue on steroids, rocephin, bronchodilators & encourage incentive spirometry.   HLD: continue on statin    HTN: continue on bisoprolol, losartan      DVT prophylaxis: lovenox  Code Status: full  Family Communication: discussed pt's care w/ pt's family at bedside and answered their questions  Disposition Plan: likely d/c back home   Level of care: Stepdown  Status is: Inpatient Remains inpatient appropriate because: severity of illness   Consultants:    Procedures:   Antimicrobials: rocephin   Subjective: Pt c/o shortness of breath   Objective: Vitals:   11/21/22 2042 11/22/22 0000 11/22/22 0232 11/22/22 0400  BP:  (!) 148/64  (!) 106/48  Pulse:  71 65 70  Resp: 17 (!) 22 19 (!) 21  Temp:  98.6 F (37 C)  98.2 F (36.8 C)  TempSrc:  Oral  Oral  SpO2: 95% 92% 98% 98%  Weight:      Height:        Intake/Output Summary (Last 24 hours) at 11/22/2022 0811 Last data filed at 11/22/2022 0700 Gross per 24 hour  Intake 461.89 ml  Output 800 ml  Net -338.11 ml   Filed Weights   11/21/22 0711  Weight: 53.4 kg    Examination:  General exam: Appears calm and comfortable  Respiratory system: decreased breath sounds b/l  Cardiovascular system: S1 & S2 +. No rubs, gallops or clicks. Gastrointestinal system: Abdomen is nondistended, soft and nontender. Normal bowel sounds heard. Central nervous system: Alert and  oriented. Moves all extremities  Psychiatry: Judgement and insight appear normal. Mood & affect appropriate.     Data Reviewed: I have personally reviewed following labs and imaging studies  CBC: Recent Labs  Lab 11/21/22 0721 11/22/22 0547  WBC 4.2 5.3  NEUTROABS 2.1  --   HGB 13.9 12.7  HCT 43.5 39.6  MCV 98.9 99.0  PLT 203 209   Basic Metabolic Panel: Recent Labs  Lab 11/21/22 0721 11/22/22 0547  NA 140 138  K 3.7 4.4  CL 103 103  CO2 30 30  GLUCOSE 103* 135*  BUN 16 18  CREATININE 0.52 0.58  CALCIUM 8.7* 8.9   GFR: Estimated Creatinine Clearance: 50.4 mL/min (by C-G formula based on SCr of 0.58 mg/dL). Liver Function Tests: Recent Labs  Lab 11/21/22 0721 11/22/22 0547  AST 23 13*  ALT 14 11  ALKPHOS 51 41  BILITOT 0.4 0.2*  PROT 6.8 6.1*  ALBUMIN 3.8 3.4*   No results for input(s): "LIPASE", "AMYLASE" in the last 168 hours. No results for input(s): "AMMONIA" in the last 168 hours. Coagulation Profile: No results for input(s): "INR", "PROTIME" in the last 168 hours. Cardiac Enzymes: No results for input(s): "CKTOTAL", "CKMB", "CKMBINDEX", "TROPONINI" in the last 168 hours. BNP (last 3 results) No results for input(s): "PROBNP" in the last 8760 hours. HbA1C: No results for input(s): "HGBA1C" in the last 72 hours. CBG: Recent Labs  Lab 11/21/22 1000  GLUCAP 93  Lipid Profile: No results for input(s): "CHOL", "HDL", "LDLCALC", "TRIG", "CHOLHDL", "LDLDIRECT" in the last 72 hours. Thyroid Function Tests: No results for input(s): "TSH", "T4TOTAL", "FREET4", "T3FREE", "THYROIDAB" in the last 72 hours. Anemia Panel: No results for input(s): "VITAMINB12", "FOLATE", "FERRITIN", "TIBC", "IRON", "RETICCTPCT" in the last 72 hours. Sepsis Labs: No results for input(s): "PROCALCITON", "LATICACIDVEN" in the last 168 hours.  Recent Results (from the past 240 hour(s))  SARS Coronavirus 2 by RT PCR (hospital order, performed in [facility] hospital lab)  *cepheid single result test* Anterior Nasal Swab     Status: None   Collection Time: 11/21/22  7:21 AM   Specimen: Anterior Nasal Swab  Result Value Ref Range Status   SARS Coronavirus 2 by RT PCR NEGATIVE NEGATIVE Final    Comment: (NOTE) SARS-CoV-2 target nucleic acids are NOT DETECTED.  The SARS-CoV-2 RNA is generally detectable in upper and lower respiratory specimens during the acute phase of infection. The lowest concentration of SARS-CoV-2 viral copies this assay can detect is 250 copies / mL. A negative result does not preclude SARS-CoV-2 infection and should not be used as the sole basis for treatment or other patient management decisions.  A negative result may occur with improper specimen collection / handling, submission of specimen other than nasopharyngeal swab, presence of viral mutation(s) within the areas targeted by this assay, and inadequate number of viral copies (<250 copies / mL). A negative result must be combined with clinical observations, patient history, and epidemiological information.  Fact Sheet for Patients:   RoadLapTop.co.za  Fact Sheet for Healthcare Providers: http://kim-miller.com/  This test is not yet approved or  cleared by the [place] FDA and has been authorized for detection and/or diagnosis of SARS-CoV-2 by FDA under an Emergency Use Authorization (EUA).  This EUA will remain in effect (meaning this test can be used) for the duration of the COVID-19 declaration under Section 564(b)(1) of the Act, 21 U.S.C. section 360bbb-3(b)(1), unless the authorization is terminated or revoked sooner.  Performed at [facility], [address] [place], [place] [address]   MRSA Next Gen by PCR, Nasal     Status: None   Collection Time: 11/21/22 10:00 AM   Specimen: Nasal Mucosa; Nasal Swab  Result Value Ref Range Status   MRSA by PCR Next Gen NOT DETECTED NOT DETECTED Final    Comment:  (NOTE) The GeneXpert MRSA Assay (FDA approved for NASAL specimens only), is one component of a comprehensive MRSA colonization surveillance program. It is not intended to diagnose MRSA infection nor to guide or monitor treatment for MRSA infections. Test performance is not FDA approved in patients less than 22 years old. Performed at [facility], [address] [place], [place] [address]          Radiology Studies: DG Chest Portable 1 View  Result Date: 11/21/2022 CLINICAL DATA:  sob.  Cough. EXAM: PORTABLE CHEST 1 VIEW COMPARISON:  10/04/2022. FINDINGS: Redemonstration of increased bilateral interstitial markings, similar to the prior study. No overt pulmonary edema. Bilateral lungs otherwise clear. No dense consolidation or major lung collapse. Bilateral lateral costophrenic angles are clear. Normal cardio-mediastinal silhouette. No acute osseous abnormalities. The soft tissues are within normal limits. IMPRESSION: No acute cardiopulmonary process. Chronic interstitial lung disease. Electronically Signed   By: Jules Schick M.D.   On: 11/21/2022 08:20        Scheduled Meds:  atorvastatin  80 mg Oral Daily   bisoprolol  2.5 mg Oral Daily  Chlorhexidine Gluconate Cloth  6 each Topical Daily   enoxaparin (LOVENOX) injection  40 mg Subcutaneous Q24H   ipratropium-albuterol  3 mL Nebulization Q6H   losartan  12.5 mg Oral Daily   [START ON 11/23/2022] predniSONE  40 mg Oral Q breakfast   Continuous Infusions:  sodium chloride Stopped (11/21/22 2340)   cefTRIAXone (ROCEPHIN)  IV 1 g (11/22/22 0538)     LOS: 1 day      Charise Killian, MD [facility] Pager [phone]  If 7PM-7AM, please contact night-coverage  11/22/2022, 8:11 AM

## 2022-11-22 NOTE — Evaluation (Signed)
Occupational Therapy Evaluation Patient Details Name: Madison Frank MRN: [id number] DOB: 24-Aug-1945 Today's Date: 11/22/2022   History of Present Illness Madison Frank is a 77 y.o. female with medical history significant of COPD, GERD, hypertension, hyperlipidemia, chronic respiratory failure on 3 L presenting with acute on chronic respiratory failure with hypoxia, COPD exacerbation.   Clinical Impression   Ms [name] was seen for OT evaluation this date. Prior to hospital admission, pt was IND, on 3L [place] as needed. Pt lives with spouse. Pt currently MOD I don B socks in long sitting. SUPERVISION for standing grooming tasks and ADL t/f ~200 ft. On arrival pt on 2L [place] with SpO2 87% during mobility, resolved to 94% on baseline 3L [place]. Spouse and pt educated on energy conservation strategies. Pt denies acute OT needs, education complete, will sign off. Upon hospital discharge, recommend no OT follow up.      If plan is discharge home, recommend the following: Help with stairs or ramp for entrance    Functional Status Assessment  Patient has had a recent decline in their functional status and demonstrates the ability to make significant improvements in function in a reasonable and predictable amount of time.  Equipment Recommendations  None recommended by OT    Recommendations for Other Services       Precautions / Restrictions Precautions Precautions: Fall Restrictions Weight Bearing Restrictions: No      Mobility Bed Mobility Overal bed mobility: Modified Independent                  Transfers Overall transfer level: Independent                 General transfer comment: from elevated bed height      Balance Overall balance assessment: No apparent balance deficits (not formally assessed)                                         ADL either performed or assessed with clinical judgement   ADL Overall ADL's : Needs assistance/impaired                                        General ADL Comments: MOD I don B socks in long sitting. SUPERVISION for standing grooming tasks and ADL t/f ~200 ft.       Pertinent Vitals/Pain Pain Assessment Pain Assessment: No/denies pain     Extremity/Trunk Assessment Upper Extremity Assessment Upper Extremity Assessment: Overall WFL for tasks assessed   Lower Extremity Assessment Lower Extremity Assessment: Overall WFL for tasks assessed       Communication Communication Communication: Hearing impairment   Cognition Arousal: Alert Behavior During Therapy: WFL for tasks assessed/performed Overall Cognitive Status: Within Functional Limits for tasks assessed                                       General Comments  SpO2 87% on 2L [place], resolved to 94% on 3L  during mobility            Home Living Family/patient expects to be discharged to:: Private residence Living Arrangements: Spouse/significant other Available Help at Discharge: Family Type of Home: House Home Access: Level entry  Home Layout: One level     Bathroom Shower/Tub: Tub/shower unit         Home Equipment: None   Additional Comments: baseline 3L  as needed      Prior Functioning/Environment Prior Level of Function : Independent/Modified Independent                        OT Problem List: Decreased activity tolerance         OT Goals(Current goals can be found in the care plan section) Acute Rehab OT Goals Patient Stated Goal: go home OT Goal Formulation: With patient/family Time For Goal Achievement: 11/22/22 Potential to Achieve Goals: Good   AM-PAC OT "6 Clicks" Daily Activity     Outcome Measure Help from another person eating meals?: None Help from another person taking care of personal grooming?: None Help from another person toileting, which includes using toliet, bedpan, or urinal?: A Little Help from another person bathing (including washing,  rinsing, drying)?: A Little Help from another person to put on and taking off regular upper body clothing?: None Help from another person to put on and taking off regular lower body clothing?: None 6 Click Score: 22   End of Session Equipment Utilized During Treatment: Rolling walker (2 wheels) Nurse Communication: Mobility status  Activity Tolerance: Patient tolerated treatment well Patient left: in bed;with call bell/phone within reach;with family/visitor present  OT Visit Diagnosis: Other abnormalities of gait and mobility (R26.89)                Time: 4098-1191 OT Time Calculation (min): 24 min Charges:  OT General Charges $OT Visit: 1 Visit OT Evaluation $OT Eval Low Complexity: 1 Low OT Treatments $Self Care/Home Management : 8-22 mins  Kathie Dike, M.S. OTR/L  11/22/22, 10:09 AM  ascom [phone]

## 2022-11-22 NOTE — Progress Notes (Signed)
Pt alert and oriented, denies any c/o pain. Pt is HOH husband at bedside. Vital signs obtained RR 24 pt admitted for dyspnea hx of COPD using chronic 02 3L/ at home. Pt transferred from ICU.

## 2022-11-22 NOTE — Plan of Care (Signed)
  Problem: Education: Goal: Understanding of CV disease, CV risk reduction, and recovery process will improve Outcome: Progressing   Problem: Activity: Goal: Ability to return to baseline activity level will improve Outcome: Progressing   Problem: Cardiovascular: Goal: Ability to achieve and maintain adequate cardiovascular perfusion will improve Outcome: Progressing Goal: Vascular access site(s) Level 0-1 will be maintained Outcome: Progressing   Problem: Health Behavior/Discharge Planning: Goal: Ability to safely manage health-related needs after discharge will improve Outcome: Progressing   Problem: Education: Goal: Knowledge of General Education information will improve Description: Including pain rating scale, medication(s)/side effects and non-pharmacologic comfort measures Outcome: Progressing   Problem: Health Behavior/Discharge Planning: Goal: Ability to manage health-related needs will improve Outcome: Progressing   Problem: Clinical Measurements: Goal: Ability to maintain clinical measurements within normal limits will improve Outcome: Progressing Goal: Will remain free from infection Outcome: Progressing Goal: Diagnostic test results will improve Outcome: Progressing Goal: Respiratory complications will improve Outcome: Progressing Goal: Cardiovascular complication will be avoided Outcome: Progressing   Problem: Activity: Goal: Risk for activity intolerance will decrease Outcome: Progressing   Problem: Nutrition: Goal: Adequate nutrition will be maintained Outcome: Progressing   Problem: Coping: Goal: Level of anxiety will decrease Outcome: Progressing   Problem: Elimination: Goal: Will not experience complications related to bowel motility Outcome: Progressing   Problem: Pain Managment: Goal: General experience of comfort will improve Outcome: Progressing   Problem: Safety: Goal: Ability to remain free from injury will improve Outcome:  Progressing   Problem: Skin Integrity: Goal: Risk for impaired skin integrity will decrease Outcome: Progressing

## 2022-11-22 NOTE — Evaluation (Signed)
Physical Therapy Evaluation Patient Details Name: Madison Frank MRN: [id number] DOB: 28-Feb-1945 Today's Date: 11/22/2022  History of Present Illness  Madison Frank is a 77 y.o. female with medical history significant of COPD, GERD, hypertension, hyperlipidemia, chronic respiratory failure on 3 L presenting with acute on chronic respiratory failure with hypoxia, COPD exacerbation.  Clinical Impression  Pt pleasant and despite some hesitancy 2/2 fatigue/O2 concerns she was eager to do some ambulation.  Ulimately ambulated ~350 ft, the first half on room air, no AD with consistent cadence and ability to maintain >90% SpO2 for all but the final 30 feet of the first loop, then another loop on 2L with SpO2 remaining in the low 90s.  Pt moved well and with good safety but is feeling weaker and slower than her baseline - she will benefit from continued PT to address functional limitations and to facilitate safe d/c plan.       If plan is discharge home, recommend the following: Assist for transportation   Can travel by private vehicle        Equipment Recommendations None recommended by PT  Recommendations for Other Services       Functional Status Assessment Patient has had a recent decline in their functional status and demonstrates the ability to make significant improvements in function in a reasonable and predictable amount of time.     Precautions / Restrictions Precautions Precautions: Fall Restrictions Weight Bearing Restrictions: No      Mobility  Bed Mobility Overal bed mobility: Modified Independent                  Transfers Overall transfer level: Modified independent                 General transfer comment: from elevated bed height    Ambulation/Gait Ambulation/Gait assistance: Supervision Gait Distance (Feet): 350 Feet Assistive device: None         General Gait Details: Pt was able to maintain relatively consistent and confident cadence  with prolonged bout of ambulation, first 200 ft on room air with SpO2 remaining > 90 for the first ~175 ft, bumped to 2L for last 150 ft with Sats remaining >88% t/o  Stairs            Wheelchair Mobility     Tilt Bed    Modified Rankin (Stroke Patients Only)       Balance Overall balance assessment: No apparent balance deficits (not formally assessed)                                           Pertinent Vitals/Pain Pain Assessment Pain Assessment: No/denies pain    Home Living Family/patient expects to be discharged to:: Private residence Living Arrangements: Spouse/significant other Available Help at Discharge: Family Type of Home: House Home Access: Level entry       Home Layout: One level Home Equipment: None Additional Comments: baseline 3L [place] as needed    Prior Function Prior Level of Function : Independent/Modified Independent                     Extremity/Trunk Assessment   Upper Extremity Assessment Upper Extremity Assessment: Overall WFL for tasks assessed    Lower Extremity Assessment Lower Extremity Assessment: Overall WFL for tasks assessed       Communication   Communication Communication: Hearing impairment  Cognition Arousal: Alert Behavior During Therapy: WFL for tasks assessed/performed Overall Cognitive Status: Within Functional Limits for tasks assessed                                          General Comments General comments (skin integrity, edema, etc.): ~1 loop on room air, then up to 2L for final loop    Exercises     Assessment/Plan    PT Assessment Patient needs continued PT services  PT Problem List Decreased strength;Decreased activity tolerance;Decreased safety awareness;Decreased knowledge of use of DME;Cardiopulmonary status limiting activity       PT Treatment Interventions Gait training;Therapeutic activities;Therapeutic exercise;Functional mobility training;Stair  training;Patient/family education    PT Goals (Current goals can be found in the Care Plan section)  Acute Rehab PT Goals Patient Stated Goal: Get breathing better PT Goal Formulation: With patient Time For Goal Achievement: 12/05/22 Potential to Achieve Goals: Good    Frequency Min 1X/week     Co-evaluation               AM-PAC PT "6 Clicks" Mobility  Outcome Measure Help needed turning from your back to your side while in a flat bed without using bedrails?: None Help needed moving from lying on your back to sitting on the side of a flat bed without using bedrails?: None Help needed moving to and from a bed to a chair (including a wheelchair)?: None Help needed standing up from a chair using your arms (e.g., wheelchair or bedside chair)?: None Help needed to walk in hospital room?: A Little Help needed climbing 3-5 steps with a railing? : A Little 6 Click Score: 22    End of Session Equipment Utilized During Treatment: Gait belt Activity Tolerance: Patient limited by fatigue;Patient tolerated treatment well Patient left: with call bell/phone within reach;with bed alarm set;with family/visitor present Nurse Communication: Mobility status PT Visit Diagnosis: Muscle weakness (generalized) (M62.81);Difficulty in walking, not elsewhere classified (R26.2)    Time: 1610-9604 PT Time Calculation (min) (ACUTE ONLY): 29 min   Charges:   PT Evaluation $PT Eval Low Complexity: 1 Low PT Treatments $Gait Training: 8-22 mins PT General Charges $$ ACUTE PT VISIT: 1 Visit         Malachi Pro, DPT 11/22/2022, 1:21 PM

## 2022-11-23 DIAGNOSIS — J441 Chronic obstructive pulmonary disease with (acute) exacerbation: Secondary | ICD-10-CM | POA: Diagnosis not present

## 2022-11-23 DIAGNOSIS — J9621 Acute and chronic respiratory failure with hypoxia: Secondary | ICD-10-CM | POA: Diagnosis not present

## 2022-11-23 MED ORDER — SODIUM CHLORIDE 0.9 % IV SOLN
500.0000 mg | INTRAVENOUS | Status: DC
  Administered 2022-11-23: 500 mg via INTRAVENOUS
  Filled 2022-11-23 (×2): qty 5

## 2022-11-23 MED ORDER — HYDROCOD POLI-CHLORPHE POLI ER 10-8 MG/5ML PO SUER
5.0000 mL | Freq: Two times a day (BID) | ORAL | Status: DC
  Administered 2022-11-23 – 2022-11-24 (×3): 5 mL via ORAL
  Filled 2022-11-23 (×3): qty 5

## 2022-11-23 MED ORDER — METHYLPREDNISOLONE SODIUM SUCC 40 MG IJ SOLR
40.0000 mg | Freq: Two times a day (BID) | INTRAMUSCULAR | Status: DC
  Administered 2022-11-23 – 2022-11-24 (×3): 40 mg via INTRAVENOUS
  Filled 2022-11-23 (×3): qty 1

## 2022-11-23 MED ORDER — IPRATROPIUM-ALBUTEROL 0.5-2.5 (3) MG/3ML IN SOLN
3.0000 mL | Freq: Four times a day (QID) | RESPIRATORY_TRACT | Status: DC
Start: 2022-11-23 — End: 2022-11-23

## 2022-11-23 MED ORDER — IPRATROPIUM-ALBUTEROL 0.5-2.5 (3) MG/3ML IN SOLN
3.0000 mL | Freq: Three times a day (TID) | RESPIRATORY_TRACT | Status: DC
  Administered 2022-11-23 – 2022-11-24 (×3): 3 mL via RESPIRATORY_TRACT
  Filled 2022-11-23 (×3): qty 3

## 2022-11-23 MED ORDER — IPRATROPIUM-ALBUTEROL 0.5-2.5 (3) MG/3ML IN SOLN
3.0000 mL | Freq: Four times a day (QID) | RESPIRATORY_TRACT | Status: DC
  Administered 2022-11-23: 3 mL via RESPIRATORY_TRACT
  Filled 2022-11-23: qty 3

## 2022-11-23 MED ORDER — IPRATROPIUM-ALBUTEROL 0.5-2.5 (3) MG/3ML IN SOLN
3.0000 mL | RESPIRATORY_TRACT | Status: DC | PRN
  Administered 2022-11-23: 3 mL via RESPIRATORY_TRACT
  Filled 2022-11-23: qty 3

## 2022-11-23 NOTE — Progress Notes (Signed)
Pt admitted for acute on chronic respiratory failure. She c/o having increased SOB worsening after walking to the bathroom. She is currently on 2l of oxygen continuously. Scheduled neb tx administered with minimal relief. Pt with audible wheezing and labored breathing.  MD notified, changes made to her neb orders. Additional tx administered at 430 am which was effective. Pt receiving IV rocephin Q24 which was tolerated well with no concerns. She is resting In bed with no additional concerns.

## 2022-11-23 NOTE — Plan of Care (Signed)
  Problem: Education: Goal: Understanding of CV disease, CV risk reduction, and recovery process will improve Outcome: Progressing Goal: Individualized Educational Video(s) Outcome: Progressing   Problem: Activity: Goal: Ability to return to baseline activity level will improve Outcome: Progressing   Problem: Cardiovascular: Goal: Ability to achieve and maintain adequate cardiovascular perfusion will improve Outcome: Progressing Goal: Vascular access site(s) Level 0-1 will be maintained Outcome: Progressing   Problem: Health Behavior/Discharge Planning: Goal: Ability to safely manage health-related needs after discharge will improve Outcome: Progressing   Problem: Education: Goal: Knowledge of General Education information will improve Description: Including pain rating scale, medication(s)/side effects and non-pharmacologic comfort measures Outcome: Progressing   Problem: Health Behavior/Discharge Planning: Goal: Ability to manage health-related needs will improve Outcome: Progressing   Problem: Clinical Measurements: Goal: Ability to maintain clinical measurements within normal limits will improve Outcome: Progressing Goal: Will remain free from infection Outcome: Progressing Goal: Diagnostic test results will improve Outcome: Progressing Goal: Respiratory complications will improve Outcome: Progressing Goal: Cardiovascular complication will be avoided Outcome: Progressing   Problem: Activity: Goal: Risk for activity intolerance will decrease Outcome: Progressing   Problem: Nutrition: Goal: Adequate nutrition will be maintained Outcome: Progressing   Problem: Coping: Goal: Level of anxiety will decrease Outcome: Progressing   Problem: Elimination: Goal: Will not experience complications related to bowel motility Outcome: Progressing Goal: Will not experience complications related to urinary retention Outcome: Progressing   Problem: Pain Managment: Goal:  General experience of comfort will improve Outcome: Progressing   Problem: Safety: Goal: Ability to remain free from injury will improve Outcome: Progressing   Problem: Skin Integrity: Goal: Risk for impaired skin integrity will decrease Outcome: Progressing   Problem: Education: Goal: Knowledge of disease or condition will improve Outcome: Progressing Goal: Knowledge of the prescribed therapeutic regimen will improve Outcome: Progressing Goal: Individualized Educational Video(s) Outcome: Progressing   Problem: Activity: Goal: Ability to tolerate increased activity will improve Outcome: Progressing Goal: Will verbalize the importance of balancing activity with adequate rest periods Outcome: Progressing   Problem: Respiratory: Goal: Ability to maintain a clear airway will improve Outcome: Progressing Goal: Levels of oxygenation will improve Outcome: Progressing Goal: Ability to maintain adequate ventilation will improve Outcome: Progressing

## 2022-11-23 NOTE — Plan of Care (Addendum)
Patient is alert and oriented x4. She is in 2 lit of oxygen continuously.she has ben getting a schedule breathing treatment, and I/v abx.denies any c/o pain.  Problem: Education: Goal: Understanding of CV disease, CV risk reduction, and recovery process will improve Outcome: Progressing Goal: Individualized Educational Video(s) Outcome: Progressing   Problem: Activity: Goal: Ability to return to baseline activity level will improve Outcome: Progressing   Problem: Cardiovascular: Goal: Ability to achieve and maintain adequate cardiovascular perfusion will improve Outcome: Progressing Goal: Vascular access site(s) Level 0-1 will be maintained Outcome: Progressing   Problem: Health Behavior/Discharge Planning: Goal: Ability to safely manage health-related needs after discharge will improve Outcome: Progressing   Problem: Education: Goal: Knowledge of General Education information will improve Description: Including pain rating scale, medication(s)/side effects and non-pharmacologic comfort measures Outcome: Progressing   Problem: Health Behavior/Discharge Planning: Goal: Ability to manage health-related needs will improve Outcome: Progressing   Problem: Clinical Measurements: Goal: Ability to maintain clinical measurements within normal limits will improve Outcome: Progressing Goal: Will remain free from infection Outcome: Progressing Goal: Diagnostic test results will improve Outcome: Progressing Goal: Respiratory complications will improve Outcome: Progressing Goal: Cardiovascular complication will be avoided Outcome: Progressing   Problem: Activity: Goal: Risk for activity intolerance will decrease Outcome: Progressing   Problem: Nutrition: Goal: Adequate nutrition will be maintained Outcome: Progressing   Problem: Coping: Goal: Level of anxiety will decrease Outcome: Progressing   Problem: Elimination: Goal: Will not experience complications related to bowel  motility Outcome: Progressing Goal: Will not experience complications related to urinary retention Outcome: Progressing   Problem: Pain Managment: Goal: General experience of comfort will improve Outcome: Progressing   Problem: Safety: Goal: Ability to remain free from injury will improve Outcome: Progressing   Problem: Skin Integrity: Goal: Risk for impaired skin integrity will decrease Outcome: Progressing   Problem: Education: Goal: Knowledge of disease or condition will improve Outcome: Progressing Goal: Knowledge of the prescribed therapeutic regimen will improve Outcome: Progressing Goal: Individualized Educational Video(s) Outcome: Progressing   Problem: Activity: Goal: Ability to tolerate increased activity will improve Outcome: Progressing Goal: Will verbalize the importance of balancing activity with adequate rest periods Outcome: Progressing   Problem: Respiratory: Goal: Ability to maintain a clear airway will improve Outcome: Progressing Goal: Levels of oxygenation will improve Outcome: Progressing Goal: Ability to maintain adequate ventilation will improve Outcome: Progressing

## 2022-11-23 NOTE — TOC Initial Note (Addendum)
Transition of Care [facility]) - Initial/Assessment Note    Patient Details  Name: Madison Frank MRN: [id number] Date of Birth: [birth year]  [facility] CM/SW Contact:    Allena Katz, LCSW Phone Number: 11/23/2022, 12:47 PM  Clinical Narrative:    CSW spoke with patient regarding home health care. Pt reports she is intersteed and does not have a preference. Pt lives at home with her husband. She reports she does not use any AD and does not need any for home. Pt reports husband drives her to appointments and that she uses [employer] home oxygen 3L at home. CSW to make referral to [facility] PT/OT/RN   Elnita Maxwell with [facility] accepted for PT/OT/RN       Patient Goals and CMS Choice            Expected Discharge Plan and Services                                              Prior Living Arrangements/Services                       Activities of Daily Living   ADL Screening (condition at time of admission) Independently performs ADLs?: Yes (appropriate for developmental age) Does the patient have a NEW difficulty with bathing/dressing/toileting/self-feeding that is expected to last >3 days?: No Does the patient have a NEW difficulty with getting in/out of bed, walking, or climbing stairs that is expected to last >3 days?: No Does the patient have a NEW difficulty with communication that is expected to last >3 days?: No Is the patient deaf or have difficulty hearing?: Yes Does the patient have difficulty seeing, even when wearing glasses/contacts?: No Does the patient have difficulty concentrating, remembering, or making decisions?: No  Permission Sought/Granted                  Emotional Assessment              Admission diagnosis:  COPD exacerbation (HCC) [J44.1] Acute on chronic respiratory failure with hypoxia (HCC) [J96.21] Acute on chronic respiratory failure with hypoxia and hypercapnia (HCC) [K44.01, J96.22] Patient Active Problem  List   Diagnosis Date Noted   HTN (hypertension) 11/21/2022   HLD (hyperlipidemia) 11/21/2022   Elevated troponin 10/05/2022   COPD exacerbation (HCC) 10/04/2022   Acute on chronic respiratory failure with hypoxia (HCC) 10/04/2022   History of migraine 10/04/2022   S/P total knee arthroplasty 03/17/2013   PCP:  Oneita Hurt, No Pharmacy:   [facility] - [place], [place] - [address] [place] [address] Phone: [phone] Fax: [phone]  [facility] - [place], [place] - [address] [place] [address] Phone: [phone] Fax: [phone]     Social Determinants of Health (SDOH) Social History: SDOH Screenings   Food Insecurity: No Food Insecurity (11/21/2022)  Housing: Low Risk  (11/21/2022)  Transportation Needs: No Transportation Needs (11/21/2022)  Utilities: Not At Risk (11/21/2022)  Financial Resource Strain: Low Risk  (05/18/2022)   [facility] from [facility]  Physical Activity: Sufficiently Active (04/12/2022)   [facility]  Social Connections: Socially Integrated (04/12/2022)   Received from [facility]  Stress: No Stress Concern Present (04/12/2022)   Received from [facility]  Tobacco Use: Medium  Risk (10/30/2022)   SDOH Interventions:     Readmission Risk Interventions    11/23/2022   12:47 PM  Readmission Risk Prevention Plan  Post Dischage Appt Complete  Medication Screening Complete  Transportation Screening Complete

## 2022-11-23 NOTE — Progress Notes (Signed)
PROGRESS NOTE    Madison Frank  ZDG:[id number] DOB: 09-Sep-1945 DOA: 11/21/2022 PCP: Pcp, No   Assessment & Plan:   Principal Problem:   Acute on chronic respiratory failure with hypoxia (HCC) Active Problems:   COPD exacerbation (HCC)   HTN (hypertension)   HLD (hyperlipidemia)  Assessment and Plan: Acute on chronic hypoxic respiratory failure: likely secondary to COPD exacerbation. Uses 3L [place] at home. Continue on supplemental oxygen   COPD exacerbation: continue on IV steroids, changed abx to IV azithromycin for its anti-inflammatory properties, & encourage incentive spirometry   HLD: continue on statin    HTN:  continue on losartan, bisoprolol     DVT prophylaxis: lovenox  Code Status: full  Family Communication: discussed pt's care w/ pt's family at bedside and answered their questions  Disposition Plan: likely d/c back home   Level of care: Med-Surg  Status is: Inpatient Remains inpatient appropriate because: severity of illness   Consultants:    Procedures:   Antimicrobials: azithromycin    Subjective: Pt c/o increased shortness of breath   Objective: Vitals:   11/22/22 1629 11/22/22 2030 11/22/22 2058 11/23/22 0440  BP: (!) 144/56  136/67 (!) 174/79  Pulse: 65  69 63  Resp: (!) 24  17 18   Temp: 98.1 F (36.7 C)  97.6 F (36.4 C)   TempSrc: Oral  Oral   SpO2:  93% 94% 98%  Weight:      Height:        Intake/Output Summary (Last 24 hours) at 11/23/2022 0749 Last data filed at 11/22/2022 1500 Gross per 24 hour  Intake 94.92 ml  Output 550 ml  Net -455.08 ml   Filed Weights   11/21/22 0711  Weight: 53.4 kg    Examination:  General exam: appears uncomfortable  Respiratory system: severely diminished breath sounds b/l  Cardiovascular system: S1/S2+. No rubs or clicks Gastrointestinal system: abd is soft, NT, ND & hypoactive bowel sounds  Central nervous system: alert & oriented. Moves all extremities  Psychiatry: Judgement and insight  appears normal. Flat mood and affect     Data Reviewed: I have personally reviewed following labs and imaging studies  CBC: Recent Labs  Lab 11/21/22 0721 11/22/22 0547  WBC 4.2 5.3  NEUTROABS 2.1  --   HGB 13.9 12.7  HCT 43.5 39.6  MCV 98.9 99.0  PLT 203 209   Basic Metabolic Panel: Recent Labs  Lab 11/21/22 0721 11/22/22 0547  NA 140 138  K 3.7 4.4  CL 103 103  CO2 30 30  GLUCOSE 103* 135*  BUN 16 18  CREATININE 0.52 0.58  CALCIUM 8.7* 8.9   GFR: Estimated Creatinine Clearance: 50.4 mL/min (by C-G formula based on SCr of 0.58 mg/dL). Liver Function Tests: Recent Labs  Lab 11/21/22 0721 11/22/22 0547  AST 23 13*  ALT 14 11  ALKPHOS 51 41  BILITOT 0.4 0.2*  PROT 6.8 6.1*  ALBUMIN 3.8 3.4*   No results for input(s): "LIPASE", "AMYLASE" in the last 168 hours. No results for input(s): "AMMONIA" in the last 168 hours. Coagulation Profile: No results for input(s): "INR", "PROTIME" in the last 168 hours. Cardiac Enzymes: No results for input(s): "CKTOTAL", "CKMB", "CKMBINDEX", "TROPONINI" in the last 168 hours. BNP (last 3 results) No results for input(s): "PROBNP" in the last 8760 hours. HbA1C: No results for input(s): "HGBA1C" in the last 72 hours. CBG: Recent Labs  Lab 11/21/22 1000  GLUCAP 93   Lipid Profile: No results for input(s): "CHOL", "HDL", "  LDLCALC", "TRIG", "CHOLHDL", "LDLDIRECT" in the last 72 hours. Thyroid Function Tests: No results for input(s): "TSH", "T4TOTAL", "FREET4", "T3FREE", "THYROIDAB" in the last 72 hours. Anemia Panel: No results for input(s): "VITAMINB12", "FOLATE", "FERRITIN", "TIBC", "IRON", "RETICCTPCT" in the last 72 hours. Sepsis Labs: No results for input(s): "PROCALCITON", "LATICACIDVEN" in the last 168 hours.  Recent Results (from the past 240 hour(s))  SARS Coronavirus 2 by RT PCR (hospital order, performed in [facility] hospital lab) *cepheid single result test* Anterior Nasal Swab     Status: None    Collection Time: 11/21/22  7:21 AM   Specimen: Anterior Nasal Swab  Result Value Ref Range Status   SARS Coronavirus 2 by RT PCR NEGATIVE NEGATIVE Final    Comment: (NOTE) SARS-CoV-2 target nucleic acids are NOT DETECTED.  The SARS-CoV-2 RNA is generally detectable in upper and lower respiratory specimens during the acute phase of infection. The lowest concentration of SARS-CoV-2 viral copies this assay can detect is 250 copies / mL. A negative result does not preclude SARS-CoV-2 infection and should not be used as the sole basis for treatment or other patient management decisions.  A negative result may occur with improper specimen collection / handling, submission of specimen other than nasopharyngeal swab, presence of viral mutation(s) within the areas targeted by this assay, and inadequate number of viral copies (<250 copies / mL). A negative result must be combined with clinical observations, patient history, and epidemiological information.  Fact Sheet for Patients:   RoadLapTop.co.za  Fact Sheet for Healthcare Providers: http://kim-miller.com/  This test is not yet approved or  cleared by the [place] FDA and has been authorized for detection and/or diagnosis of SARS-CoV-2 by FDA under an Emergency Use Authorization (EUA).  This EUA will remain in effect (meaning this test can be used) for the duration of the COVID-19 declaration under Section 564(b)(1) of the Act, 21 U.S.C. section 360bbb-3(b)(1), unless the authorization is terminated or revoked sooner.  Performed at [facility], [address] [place], [place] [address]   MRSA Next Gen by PCR, Nasal     Status: None   Collection Time: 11/21/22 10:00 AM   Specimen: Nasal Mucosa; Nasal Swab  Result Value Ref Range Status   MRSA by PCR Next Gen NOT DETECTED NOT DETECTED Final    Comment: (NOTE) The GeneXpert MRSA Assay (FDA approved for NASAL specimens  only), is one component of a comprehensive MRSA colonization surveillance program. It is not intended to diagnose MRSA infection nor to guide or monitor treatment for MRSA infections. Test performance is not FDA approved in patients less than 51 years old. Performed at [facility], [address], [place], [place] [address]          Radiology Studies: DG Chest Portable 1 View  Result Date: 11/21/2022 CLINICAL DATA:  sob.  Cough. EXAM: PORTABLE CHEST 1 VIEW COMPARISON:  10/04/2022. FINDINGS: Redemonstration of increased bilateral interstitial markings, similar to the prior study. No overt pulmonary edema. Bilateral lungs otherwise clear. No dense consolidation or major lung collapse. Bilateral lateral costophrenic angles are clear. Normal cardio-mediastinal silhouette. No acute osseous abnormalities. The soft tissues are within normal limits. IMPRESSION: No acute cardiopulmonary process. Chronic interstitial lung disease. Electronically Signed   By: Jules Schick M.D.   On: 11/21/2022 08:20        Scheduled Meds:  atorvastatin  80 mg Oral Daily   bisoprolol  2.5 mg Oral Daily   Chlorhexidine Gluconate Cloth  6 each Topical Daily  enoxaparin (LOVENOX) injection  40 mg Subcutaneous Q24H   ipratropium-albuterol  3 mL Nebulization QID   losartan  12.5 mg Oral Daily   predniSONE  40 mg Oral Q breakfast   Continuous Infusions:  cefTRIAXone (ROCEPHIN)  IV 1 g (11/23/22 0506)     LOS: 2 days      Charise Killian, MD [facility] Pager [phone]  If 7PM-7AM, please contact night-coverage  11/23/2022, 7:49 AM

## 2022-11-24 DIAGNOSIS — J441 Chronic obstructive pulmonary disease with (acute) exacerbation: Secondary | ICD-10-CM | POA: Diagnosis not present

## 2022-11-24 LAB — CBC
HCT: 40.3 % (ref 36.0–46.0)
Hemoglobin: 13.5 g/dL (ref 12.0–15.0)
MCH: 31.5 pg (ref 26.0–34.0)
MCHC: 33.5 g/dL (ref 30.0–36.0)
MCV: 94.2 fL (ref 80.0–100.0)
Platelets: 208 10*3/uL (ref 150–400)
RBC: 4.28 MIL/uL (ref 3.87–5.11)
RDW: 12.2 % (ref 11.5–15.5)
WBC: 6 10*3/uL (ref 4.0–10.5)
nRBC: 0 % (ref 0.0–0.2)

## 2022-11-24 LAB — BASIC METABOLIC PANEL
Anion gap: 9 (ref 5–15)
BUN: 16 mg/dL (ref 8–23)
CO2: 28 mmol/L (ref 22–32)
Calcium: 9.1 mg/dL (ref 8.9–10.3)
Chloride: 103 mmol/L (ref 98–111)
Creatinine, Ser: 0.43 mg/dL — ABNORMAL LOW (ref 0.44–1.00)
GFR, Estimated: >60 mL/min (ref >60)
Glucose, Bld: 120 mg/dL — ABNORMAL HIGH (ref 70–99)
Potassium: 3.9 mmol/L (ref 3.5–5.1)
Sodium: 140 mmol/L (ref 135–145)

## 2022-11-24 MED ORDER — HYDROCOD POLI-CHLORPHE POLI ER 10-8 MG/5ML PO SUER: 5.0000 mL | Freq: Two times a day (BID) | ORAL | 0 refills | Status: AC | PRN

## 2022-11-24 MED ORDER — IPRATROPIUM-ALBUTEROL 0.5-2.5 (3) MG/3ML IN SOLN: 3.0000 mL | RESPIRATORY_TRACT | 0 refills | Status: DC | PRN

## 2022-11-24 MED ORDER — PREDNISONE 10 MG PO TABS: ORAL_TABLET | ORAL | 0 refills | Status: DC

## 2022-11-24 MED ORDER — AZITHROMYCIN 250 MG PO TABS: 250.0000 mg | ORAL_TABLET | Freq: Every day | ORAL | 0 refills | Status: AC

## 2022-11-24 NOTE — Discharge Summary (Signed)
Physician Discharge Summary  Madison Frank YNW:[id number] DOB: November 03, 1945 DOA: 11/21/2022  PCP: Pcp, No  Admit date: 11/21/2022 Discharge date: 11/24/2022  Admitted From: home  Disposition:  home w/ home health   Recommendations for Outpatient Follow-up:  Follow up with PCP in 1-2 weeks   Home Health: yes Equipment/Devices: oxygen   Discharge Condition: stable  CODE STATUS: full  Diet recommendation: Heart Healthy   Brief/Interim Summary: HPI was taken from Dr. Alvester Morin: Madison Frank is a 77 y.o. female with medical history significant of COPD, GERD, hypertension, hyperlipidemia, chronic respiratory failure on 3 L presenting with acute on chronic respiratory failure with hypoxia, COPD exacerbation.  Patient reports increased work of breathing over the past 2 to 3 days.  Positive cough, wheezing, increased sputum production.  No reported sick contacts.  Patient with overall worsening malaise.  No longer smoking.  Was using home inhalers with minimal improvement in symptoms.  Increased oxygen from 3 to 4 L with still persistent symptoms.  No reported chest pain.  No reported nausea or vomiting.  No focal hemiparesis or confusion. Presented to the ER afebrile, hemodynamically stable.  Satting 78% on room air, then 88% on 4 L, transition to BiPAP.  White count 4.2, hemoglobin 13.9, platelets 203, creatinine 0.52, troponin negative x 2.  VBG with compensated respiratory acidosis.  Chest x-ray with chronic interstitial lung disease.  Discharge Diagnoses:  Principal Problem:   Acute on chronic respiratory failure with hypoxia (HCC) Active Problems:   COPD exacerbation (HCC)   HTN (hypertension)   HLD (hyperlipidemia)  Acute on chronic hypoxic respiratory failure: likely secondary to COPD exacerbation. Uses 3L [place] at home. Continue on supplemental oxygen    COPD exacerbation: continue on steroids, azithromycin, bronchodilators & encourage incentive spirometry    HLD: continue on statin     HTN:  continue on losartan, bisoprolol  Discharge Instructions  Discharge Instructions     Diet - low sodium heart healthy   Complete by: As directed    Discharge instructions   Complete by: As directed    F/u w/ PCP in 1-2 weeks   Increase activity slowly   Complete by: As directed       Allergies as of 11/24/2022   No Known Allergies      Medication List     STOP taking these medications    aspirin 325 MG tablet       TAKE these medications    acetaminophen 325 MG tablet Commonly known as: TYLENOL Take 2 tablets (650 mg total) by mouth every 6 (six) hours as needed for mild pain (or Fever >/= 101).   albuterol 108 (90 Base) MCG/ACT inhaler Commonly known as: VENTOLIN HFA Inhale into the lungs.   atorvastatin 80 MG tablet Commonly known as: LIPITOR Take 1 tablet (80 mg total) by mouth daily.   azithromycin 250 MG tablet Commonly known as: Zithromax Take 1 tablet (250 mg total) by mouth daily for 4 days.   bisoprolol 5 MG tablet Commonly known as: ZEBETA Take 0.5 tablets (2.5 mg total) by mouth daily. What changed: additional instructions   chlorpheniramine-HYDROcodone 10-8 MG/5ML Commonly known as: TUSSIONEX Take 5 mLs by mouth every 12 (twelve) hours as needed for up to 7 days for cough.   fluticasone furoate-vilanterol 200-25 MCG/ACT Aepb Commonly known as: BREO ELLIPTA Inhale 1 puff into the lungs daily. Receives samples from PCP.   ipratropium-albuterol 0.5-2.5 (3) MG/3ML Soln Commonly known as: DUONEB Take 3 mLs by nebulization every 4 (  four) hours as needed. What changed:  how much to take how to take this when to take this reasons to take this   losartan 25 MG tablet Commonly known as: COZAAR Take 0.5 tablets (12.5 mg total) by mouth daily. What changed: additional instructions   predniSONE 10 MG tablet Commonly known as: DELTASONE 40mg  daily x 2 days, 30mg  daily x 2 days, 20mg  daily x 2 days, 10mg  daily x 2 days, then stop         No Known Allergies  Consultations:   Procedures/Studies: DG Chest Portable 1 View  Result Date: 11/21/2022 CLINICAL DATA:  sob.  Cough. EXAM: PORTABLE CHEST 1 VIEW COMPARISON:  10/04/2022. FINDINGS: Redemonstration of increased bilateral interstitial markings, similar to the prior study. No overt pulmonary edema. Bilateral lungs otherwise clear. No dense consolidation or major lung collapse. Bilateral lateral costophrenic angles are clear. Normal cardio-mediastinal silhouette. No acute osseous abnormalities. The soft tissues are within normal limits. IMPRESSION: No acute cardiopulmonary process. Chronic interstitial lung disease. Electronically Signed   By: Jules Schick M.D.   On: 11/21/2022 08:20   (Echo, Carotid, EGD, Colonoscopy, ERCP)    Subjective: Pt c/o fatigue    Discharge Exam: Vitals:   11/24/22 0445 11/24/22 0743  BP: (!) 150/63 (!) 178/75  Pulse: (!) 56 (!) 57  Resp: 16 17  Temp: 97.8 F (36.6 C) 97.6 F (36.4 C)  SpO2: 92% 96%   Vitals:   11/23/22 1944 11/23/22 2037 11/24/22 0445 11/24/22 0743  BP: (!) 143/70  (!) 150/63 (!) 178/75  Pulse: 84  (!) 56 (!) 57  Resp: 16  16 17   Temp: 98.2 F (36.8 C)  97.8 F (36.6 C) 97.6 F (36.4 C)  TempSrc:    Oral  SpO2: 90% 90% 92% 96%  Weight:      Height:        General: Pt is alert, awake, not in acute distress Cardiovascular: S1/S2 +, no rubs, no gallops Respiratory: diminished breath sounds b/l  Abdominal: Soft, NT, ND, bowel sounds + Extremities: no edema, no cyanosis    The results of significant diagnostics from this hospitalization (including imaging, microbiology, ancillary and laboratory) are listed below for reference.     Microbiology: Recent Results (from the past 240 hour(s))  SARS Coronavirus 2 by RT PCR (hospital order, performed in [facility] hospital lab) *cepheid single result test* Anterior Nasal Swab     Status: None   Collection Time: 11/21/22  7:21 AM   Specimen: Anterior Nasal  Swab  Result Value Ref Range Status   SARS Coronavirus 2 by RT PCR NEGATIVE NEGATIVE Final    Comment: (NOTE) SARS-CoV-2 target nucleic acids are NOT DETECTED.  The SARS-CoV-2 RNA is generally detectable in upper and lower respiratory specimens during the acute phase of infection. The lowest concentration of SARS-CoV-2 viral copies this assay can detect is 250 copies / mL. A negative result does not preclude SARS-CoV-2 infection and should not be used as the sole basis for treatment or other patient management decisions.  A negative result may occur with improper specimen collection / handling, submission of specimen other than nasopharyngeal swab, presence of viral mutation(s) within the areas targeted by this assay, and inadequate number of viral copies (<250 copies / mL). A negative result must be combined with clinical observations, patient history, and epidemiological information.  Fact Sheet for Patients:   RoadLapTop.co.za  Fact Sheet for Healthcare Providers: http://kim-miller.com/  This test is not yet approved or  cleared by  the [employer] and has been authorized for detection and/or diagnosis of SARS-CoV-2 by FDA under an Emergency Use Authorization (EUA).  This EUA will remain in effect (meaning this test can be used) for the duration of the COVID-19 declaration under Section 564(b)(1) of the Act, 21 U.S.C. section 360bbb-3(b)(1), unless the authorization is terminated or revoked sooner.  Performed at [facility], [address] [place], [place] [address]   MRSA Next Gen by PCR, Nasal     Status: None   Collection Time: 11/21/22 10:00 AM   Specimen: Nasal Mucosa; Nasal Swab  Result Value Ref Range Status   MRSA by PCR Next Gen NOT DETECTED NOT DETECTED Final    Comment: (NOTE) The GeneXpert MRSA Assay (FDA approved for NASAL specimens only), is one component of a comprehensive MRSA colonization  surveillance program. It is not intended to diagnose MRSA infection nor to guide or monitor treatment for MRSA infections. Test performance is not FDA approved in patients less than 51 years old. Performed at [facility], [address] [place], [place] [address]      Labs: BNP (last 3 results) Recent Labs    10/04/22 1817 11/21/22 0721  BNP 52.3 81.1   Basic Metabolic Panel: Recent Labs  Lab 11/21/22 0721 11/22/22 0547 11/24/22 0602  NA 140 138 140  K 3.7 4.4 3.9  CL 103 103 103  CO2 30 30 28   GLUCOSE 103* 135* 120*  BUN 16 18 16   CREATININE 0.52 0.58 0.43*  CALCIUM 8.7* 8.9 9.1   Liver Function Tests: Recent Labs  Lab 11/21/22 0721 11/22/22 0547  AST 23 13*  ALT 14 11  ALKPHOS 51 41  BILITOT 0.4 0.2*  PROT 6.8 6.1*  ALBUMIN 3.8 3.4*   No results for input(s): "LIPASE", "AMYLASE" in the last 168 hours. No results for input(s): "AMMONIA" in the last 168 hours. CBC: Recent Labs  Lab 11/21/22 0721 11/22/22 0547 11/24/22 0602  WBC 4.2 5.3 6.0  NEUTROABS 2.1  --   --   HGB 13.9 12.7 13.5  HCT 43.5 39.6 40.3  MCV 98.9 99.0 94.2  PLT 203 209 208   Cardiac Enzymes: No results for input(s): "CKTOTAL", "CKMB", "CKMBINDEX", "TROPONINI" in the last 168 hours. BNP: Invalid input(s): "POCBNP" CBG: Recent Labs  Lab 11/21/22 1000  GLUCAP 93   D-Dimer No results for input(s): "DDIMER" in the last 72 hours. Hgb A1c No results for input(s): "HGBA1C" in the last 72 hours. Lipid Profile No results for input(s): "CHOL", "HDL", "LDLCALC", "TRIG", "CHOLHDL", "LDLDIRECT" in the last 72 hours. Thyroid function studies No results for input(s): "TSH", "T4TOTAL", "T3FREE", "THYROIDAB" in the last 72 hours.  Invalid input(s): "FREET3" Anemia work up No results for input(s): "VITAMINB12", "FOLATE", "FERRITIN", "TIBC", "IRON", "RETICCTPCT" in the last 72 hours. Urinalysis    Component Value Date/Time   COLORURINE YELLOW 03/13/2013 1441   APPEARANCEUR  CLEAR 03/13/2013 1441   APPEARANCEUR Hazy 10/16/2012 0842   LABSPEC 1.026 03/13/2013 1441   LABSPEC 1.013 10/16/2012 0842   PHURINE 6.0 03/13/2013 1441   GLUCOSEU NEGATIVE 03/13/2013 1441   GLUCOSEU Negative 10/16/2012 0842   HGBUR NEGATIVE 03/13/2013 1441   BILIRUBINUR NEGATIVE 03/13/2013 1441   BILIRUBINUR Negative 10/16/2012 0842   KETONESUR NEGATIVE 03/13/2013 1441   PROTEINUR NEGATIVE 03/13/2013 1441   UROBILINOGEN 0.2 03/13/2013 1441   NITRITE NEGATIVE 03/13/2013 1441   LEUKOCYTESUR NEGATIVE 03/13/2013 1441   LEUKOCYTESUR 2+ 10/16/2012 0842   Sepsis Labs Recent Labs  Lab 11/21/22  8657 11/22/22 0547 11/24/22 0602  WBC 4.2 5.3 6.0   Microbiology Recent Results (from the past 240 hour(s))  SARS Coronavirus 2 by RT PCR (hospital order, performed in [facility] hospital lab) *cepheid single result test* Anterior Nasal Swab     Status: None   Collection Time: 11/21/22  7:21 AM   Specimen: Anterior Nasal Swab  Result Value Ref Range Status   SARS Coronavirus 2 by RT PCR NEGATIVE NEGATIVE Final    Comment: (NOTE) SARS-CoV-2 target nucleic acids are NOT DETECTED.  The SARS-CoV-2 RNA is generally detectable in upper and lower respiratory specimens during the acute phase of infection. The lowest concentration of SARS-CoV-2 viral copies this assay can detect is 250 copies / mL. A negative result does not preclude SARS-CoV-2 infection and should not be used as the sole basis for treatment or other patient management decisions.  A negative result may occur with improper specimen collection / handling, submission of specimen other than nasopharyngeal swab, presence of viral mutation(s) within the areas targeted by this assay, and inadequate number of viral copies (<250 copies / mL). A negative result must be combined with clinical observations, patient history, and epidemiological information.  Fact Sheet for Patients:   RoadLapTop.co.za  Fact Sheet  for Healthcare Providers: http://kim-miller.com/  This test is not yet approved or  cleared by the [place] FDA and has been authorized for detection and/or diagnosis of SARS-CoV-2 by FDA under an Emergency Use Authorization (EUA).  This EUA will remain in effect (meaning this test can be used) for the duration of the COVID-19 declaration under Section 564(b)(1) of the Act, 21 U.S.C. section 360bbb-3(b)(1), unless the authorization is terminated or revoked sooner.  Performed at [facility], [address] [place], [place] [address]   MRSA Next Gen by PCR, Nasal     Status: None   Collection Time: 11/21/22 10:00 AM   Specimen: Nasal Mucosa; Nasal Swab  Result Value Ref Range Status   MRSA by PCR Next Gen NOT DETECTED NOT DETECTED Final    Comment: (NOTE) The GeneXpert MRSA Assay (FDA approved for NASAL specimens only), is one component of a comprehensive MRSA colonization surveillance program. It is not intended to diagnose MRSA infection nor to guide or monitor treatment for MRSA infections. Test performance is not FDA approved in patients less than 68 years old. Performed at [facility], [address], [place], [place] [address]      Time coordinating discharge: Over 30 minutes  SIGNED:   Charise Killian, MD  [facility] 11/24/2022, 12:12 PM Pager   If 7PM-7AM, please contact night-coverage [email or web address]

## 2022-11-24 NOTE — Progress Notes (Signed)
Patient is alert and oriented, remains on 2L of oxygen continually, scheduled neb tx with no prn treatments administer this shift. No  c/o of increased SOB this shift. Continuing to tolerate  IV solumedrol and IV abt.

## 2022-11-24 NOTE — TOC Transition Note (Signed)
Transition of Care [facility]) - CM/SW Discharge Note   Patient Details  Name: Madison Frank MRN: [id number] Date of Birth: [birth year]  Transition of Care [facility]) CM/SW Contact:  Allena Katz, LCSW Phone Number: 11/24/2022, 10:57 AM   Clinical Narrative:   Pt discharging back home with [facility]. [employer] to deliver tank to room. Cheryl with [facility] notified.          Patient Goals and CMS Choice      Discharge Placement                         Discharge Plan and Services Additional resources added to the After Visit Summary for                                       Social Determinants of Health (SDOH) Interventions SDOH Screenings   Food Insecurity: No Food Insecurity (11/21/2022)  Housing: Low Risk  (11/21/2022)  Transportation Needs: No Transportation Needs (11/21/2022)  Utilities: Not At Risk (11/21/2022)  Financial Resource Strain: Low Risk  (05/18/2022)   Received from [facility]  Physical Activity: Sufficiently Active (04/12/2022)   [facility] from [facility]  Social Connections: Socially Integrated (04/12/2022)   Received from [facility]  Stress: No Stress Concern Present (04/12/2022)   Received from [facility]  Tobacco Use: Medium Risk (10/30/2022)     Readmission Risk Interventions    11/23/2022   12:47 PM  Readmission Risk Prevention Plan  Post Dischage Appt Complete  Medication Screening Complete  Transportation Screening Complete

## 2022-11-24 NOTE — Care Management Important Message (Signed)
Important Message  Patient Details  Name: Madison Frank MRN: [id number] Date of Birth: 07/24/1945   Important Message Given:  Yes - Medicare IM     Johnell Comings 11/24/2022, 10:44 AM

## 2022-12-01 DIAGNOSIS — H401113 Primary open-angle glaucoma, right eye, severe stage: Secondary | ICD-10-CM | POA: Diagnosis not present

## 2022-12-01 DIAGNOSIS — J9621 Acute and chronic respiratory failure with hypoxia: Secondary | ICD-10-CM | POA: Diagnosis not present

## 2022-12-01 DIAGNOSIS — Z961 Presence of intraocular lens: Secondary | ICD-10-CM | POA: Diagnosis not present

## 2022-12-01 DIAGNOSIS — Z9181 History of falling: Secondary | ICD-10-CM | POA: Diagnosis not present

## 2022-12-01 DIAGNOSIS — Z9981 Dependence on supplemental oxygen: Secondary | ICD-10-CM | POA: Diagnosis not present

## 2022-12-01 DIAGNOSIS — Z7952 Long term (current) use of systemic steroids: Secondary | ICD-10-CM | POA: Diagnosis not present

## 2022-12-01 DIAGNOSIS — Z87891 Personal history of nicotine dependence: Secondary | ICD-10-CM | POA: Diagnosis not present

## 2022-12-01 DIAGNOSIS — J441 Chronic obstructive pulmonary disease with (acute) exacerbation: Secondary | ICD-10-CM | POA: Diagnosis not present

## 2022-12-01 DIAGNOSIS — I10 Essential (primary) hypertension: Secondary | ICD-10-CM | POA: Diagnosis not present

## 2022-12-01 DIAGNOSIS — H401123 Primary open-angle glaucoma, left eye, severe stage: Secondary | ICD-10-CM | POA: Diagnosis not present

## 2022-12-01 DIAGNOSIS — E785 Hyperlipidemia, unspecified: Secondary | ICD-10-CM | POA: Diagnosis not present

## 2022-12-01 DIAGNOSIS — K219 Gastro-esophageal reflux disease without esophagitis: Secondary | ICD-10-CM | POA: Diagnosis not present

## 2022-12-03 DIAGNOSIS — J449 Chronic obstructive pulmonary disease, unspecified: Secondary | ICD-10-CM | POA: Diagnosis not present

## 2022-12-06 DIAGNOSIS — K219 Gastro-esophageal reflux disease without esophagitis: Secondary | ICD-10-CM | POA: Diagnosis not present

## 2022-12-06 DIAGNOSIS — Z9981 Dependence on supplemental oxygen: Secondary | ICD-10-CM | POA: Diagnosis not present

## 2022-12-06 DIAGNOSIS — Z87891 Personal history of nicotine dependence: Secondary | ICD-10-CM | POA: Diagnosis not present

## 2022-12-06 DIAGNOSIS — Z9181 History of falling: Secondary | ICD-10-CM | POA: Diagnosis not present

## 2022-12-06 DIAGNOSIS — J441 Chronic obstructive pulmonary disease with (acute) exacerbation: Secondary | ICD-10-CM | POA: Diagnosis not present

## 2022-12-06 DIAGNOSIS — Z7952 Long term (current) use of systemic steroids: Secondary | ICD-10-CM | POA: Diagnosis not present

## 2022-12-06 DIAGNOSIS — E785 Hyperlipidemia, unspecified: Secondary | ICD-10-CM | POA: Diagnosis not present

## 2022-12-06 DIAGNOSIS — J9621 Acute and chronic respiratory failure with hypoxia: Secondary | ICD-10-CM | POA: Diagnosis not present

## 2022-12-06 DIAGNOSIS — I10 Essential (primary) hypertension: Secondary | ICD-10-CM | POA: Diagnosis not present

## 2022-12-07 DIAGNOSIS — Z9181 History of falling: Secondary | ICD-10-CM | POA: Diagnosis not present

## 2022-12-07 DIAGNOSIS — Z87891 Personal history of nicotine dependence: Secondary | ICD-10-CM | POA: Diagnosis not present

## 2022-12-07 DIAGNOSIS — Z9981 Dependence on supplemental oxygen: Secondary | ICD-10-CM | POA: Diagnosis not present

## 2022-12-07 DIAGNOSIS — I10 Essential (primary) hypertension: Secondary | ICD-10-CM | POA: Diagnosis not present

## 2022-12-07 DIAGNOSIS — Z7952 Long term (current) use of systemic steroids: Secondary | ICD-10-CM | POA: Diagnosis not present

## 2022-12-07 DIAGNOSIS — K219 Gastro-esophageal reflux disease without esophagitis: Secondary | ICD-10-CM | POA: Diagnosis not present

## 2022-12-07 DIAGNOSIS — J9621 Acute and chronic respiratory failure with hypoxia: Secondary | ICD-10-CM | POA: Diagnosis not present

## 2022-12-07 DIAGNOSIS — E785 Hyperlipidemia, unspecified: Secondary | ICD-10-CM | POA: Diagnosis not present

## 2022-12-07 DIAGNOSIS — J441 Chronic obstructive pulmonary disease with (acute) exacerbation: Secondary | ICD-10-CM | POA: Diagnosis not present

## 2022-12-07 NOTE — Progress Notes (Signed)
Cardiology Office Note    Date:  12/08/2022   ID:  [name], DOB 1945/05/24, MRN [id number]  PCP:  Pcp, No  Cardiologist:  Lorine Bears, MD  Electrophysiologist:  None   Chief Complaint: Follow-up  History of Present Illness:   Madison Frank is a 77 y.o. female with history of chronic hypoxic and hypercapnic respiratory failure on supplemental oxygen at 3 L at baseline, HFrEF, COPD with frequent exacerbations, sleep apnea, migraine, osteoarthritis, and GERD who was recently admitted to [facility] for acute on chronic hypoxic respiratory failure in the setting of COPD exacerbation in 11/2022, and presents for follow-up of her cardiomyopathy.   She has frequent ED visits/admissions for COPD exacerbations. Echo through [facility] in 11/2021 (during COPD exacerbation visit) showed an EF of 55-60%, mild mitral regurgitation, trivial tricuspid regurgitation, and an estimated PASP of 33 mmHg.  She was admitted in 09/2022 with acute on chronic hypoxic and hypercapnic respiratory failure in the setting of COPD exacerbation briefly requiring BiPAP.  High-sensitivity troponin 22 with a delta and peak troponin 164.  BNP 52.  Chest x-ray showed chronic lung disease without superimposed focal airspace opacity.  EKG showed sinus tachycardia, 102 bpm, with baseline artifact and nonspecific ST-T changes.  Echo showed an EF of 35-40% with global hypokinesis with basal wall motion best preserved (unable to exclude stress-induced cardiomyopathy), Gr1DD, normal RV systolic function and ventricular cavity size, normal PASP, aortic valve sclerosis without stenosis, and an estimated right atrial pressure of 3 mmHg.  In this setting, she was evaluated by cardiology with mildly reduced LV systolic function felt to be consistent with stress-induced cardiomyopathy in the setting of COPD exacerbation.  Outpatient ischemic evaluation was recommended once she had improved from a pulmonary status.  She was seen in hospital follow-up  on 10/30/2022 and was without symptoms of angina or cardiac decompensation.  It was unclear what medication she was taking at that time.  In the setting of her cardiomyopathy, elevated high-sensitivity troponin, and multiple risk factors she was scheduled for [facility].  However, this had to be canceled due to patient illness.  Since I last saw her, she was admitted to [facility] from 10/1 through 11/24/2022 with acute on chronic hypoxic respiratory failure secondary to COPD exacerbation.  High-sensitivity troponin negative x 2.  BNP 81.  Chest x-ray showed chronic interstitial lung disease.  She had symptomatic improvement with bronchodilators, steroids, and azithromycin.  She comes in accompanied by her husband today and is doing well from a cardiac perspective, without symptoms of angina or cardiac decompensation.  She continues to note longstanding exertional shortness of breath that has historically been attributed to COPD.  She also notes 2 different types of chest pain 1 described as a squeezing sensation and another that is sharp.  Pain randomly occurs, lasting for several minutes.  At times, she has taken ASA with symptom improvement.  No lower extremity swelling or progressive orthopnea.  No dizziness, presyncope, or syncope.  Reports being prescribed continuous oxygen, though typically only wears this at night and sometimes during the day.  Does not have a pulmonologist.  Without acute cardiopulmonary exacerbation at this time.  Would like to move forward with previously recommended cardiac cath.   Labs independently reviewed: 11/2022 - potassium 3.9, BUN 16, serum creatinine 0.43, Hgb 13.5, PLT 208, albumin 3.4, AST/ALT not elevated 09/2022 - TSH 0.232, free T4 low at 0.58 with TC 219, TG 58, HDL 73, LDL 134, A1c 6.2  Past Medical History:  Cardiology Office Note    Date:  12/08/2022   ID:  [name], DOB 1945/05/24, MRN [id number]  PCP:  Pcp, No  Cardiologist:  Lorine Bears, MD  Electrophysiologist:  None   Chief Complaint: Follow-up  History of Present Illness:   Madison Frank is a 77 y.o. female with history of chronic hypoxic and hypercapnic respiratory failure on supplemental oxygen at 3 L at baseline, HFrEF, COPD with frequent exacerbations, sleep apnea, migraine, osteoarthritis, and GERD who was recently admitted to [facility] for acute on chronic hypoxic respiratory failure in the setting of COPD exacerbation in 11/2022, and presents for follow-up of her cardiomyopathy.   She has frequent ED visits/admissions for COPD exacerbations. Echo through [facility] in 11/2021 (during COPD exacerbation visit) showed an EF of 55-60%, mild mitral regurgitation, trivial tricuspid regurgitation, and an estimated PASP of 33 mmHg.  She was admitted in 09/2022 with acute on chronic hypoxic and hypercapnic respiratory failure in the setting of COPD exacerbation briefly requiring BiPAP.  High-sensitivity troponin 22 with a delta and peak troponin 164.  BNP 52.  Chest x-ray showed chronic lung disease without superimposed focal airspace opacity.  EKG showed sinus tachycardia, 102 bpm, with baseline artifact and nonspecific ST-T changes.  Echo showed an EF of 35-40% with global hypokinesis with basal wall motion best preserved (unable to exclude stress-induced cardiomyopathy), Gr1DD, normal RV systolic function and ventricular cavity size, normal PASP, aortic valve sclerosis without stenosis, and an estimated right atrial pressure of 3 mmHg.  In this setting, she was evaluated by cardiology with mildly reduced LV systolic function felt to be consistent with stress-induced cardiomyopathy in the setting of COPD exacerbation.  Outpatient ischemic evaluation was recommended once she had improved from a pulmonary status.  She was seen in hospital follow-up  on 10/30/2022 and was without symptoms of angina or cardiac decompensation.  It was unclear what medication she was taking at that time.  In the setting of her cardiomyopathy, elevated high-sensitivity troponin, and multiple risk factors she was scheduled for [facility].  However, this had to be canceled due to patient illness.  Since I last saw her, she was admitted to [facility] from 10/1 through 11/24/2022 with acute on chronic hypoxic respiratory failure secondary to COPD exacerbation.  High-sensitivity troponin negative x 2.  BNP 81.  Chest x-ray showed chronic interstitial lung disease.  She had symptomatic improvement with bronchodilators, steroids, and azithromycin.  She comes in accompanied by her husband today and is doing well from a cardiac perspective, without symptoms of angina or cardiac decompensation.  She continues to note longstanding exertional shortness of breath that has historically been attributed to COPD.  She also notes 2 different types of chest pain 1 described as a squeezing sensation and another that is sharp.  Pain randomly occurs, lasting for several minutes.  At times, she has taken ASA with symptom improvement.  No lower extremity swelling or progressive orthopnea.  No dizziness, presyncope, or syncope.  Reports being prescribed continuous oxygen, though typically only wears this at night and sometimes during the day.  Does not have a pulmonologist.  Without acute cardiopulmonary exacerbation at this time.  Would like to move forward with previously recommended cardiac cath.   Labs independently reviewed: 11/2022 - potassium 3.9, BUN 16, serum creatinine 0.43, Hgb 13.5, PLT 208, albumin 3.4, AST/ALT not elevated 09/2022 - TSH 0.232, free T4 low at 0.58 with TC 219, TG 58, HDL 73, LDL 134, A1c 6.2  Past Medical History:  Cardiology Office Note    Date:  12/08/2022   ID:  [name], DOB 1945/05/24, MRN [id number]  PCP:  Pcp, No  Cardiologist:  Lorine Bears, MD  Electrophysiologist:  None   Chief Complaint: Follow-up  History of Present Illness:   Madison Frank is a 77 y.o. female with history of chronic hypoxic and hypercapnic respiratory failure on supplemental oxygen at 3 L at baseline, HFrEF, COPD with frequent exacerbations, sleep apnea, migraine, osteoarthritis, and GERD who was recently admitted to [facility] for acute on chronic hypoxic respiratory failure in the setting of COPD exacerbation in 11/2022, and presents for follow-up of her cardiomyopathy.   She has frequent ED visits/admissions for COPD exacerbations. Echo through [facility] in 11/2021 (during COPD exacerbation visit) showed an EF of 55-60%, mild mitral regurgitation, trivial tricuspid regurgitation, and an estimated PASP of 33 mmHg.  She was admitted in 09/2022 with acute on chronic hypoxic and hypercapnic respiratory failure in the setting of COPD exacerbation briefly requiring BiPAP.  High-sensitivity troponin 22 with a delta and peak troponin 164.  BNP 52.  Chest x-ray showed chronic lung disease without superimposed focal airspace opacity.  EKG showed sinus tachycardia, 102 bpm, with baseline artifact and nonspecific ST-T changes.  Echo showed an EF of 35-40% with global hypokinesis with basal wall motion best preserved (unable to exclude stress-induced cardiomyopathy), Gr1DD, normal RV systolic function and ventricular cavity size, normal PASP, aortic valve sclerosis without stenosis, and an estimated right atrial pressure of 3 mmHg.  In this setting, she was evaluated by cardiology with mildly reduced LV systolic function felt to be consistent with stress-induced cardiomyopathy in the setting of COPD exacerbation.  Outpatient ischemic evaluation was recommended once she had improved from a pulmonary status.  She was seen in hospital follow-up  on 10/30/2022 and was without symptoms of angina or cardiac decompensation.  It was unclear what medication she was taking at that time.  In the setting of her cardiomyopathy, elevated high-sensitivity troponin, and multiple risk factors she was scheduled for [facility].  However, this had to be canceled due to patient illness.  Since I last saw her, she was admitted to [facility] from 10/1 through 11/24/2022 with acute on chronic hypoxic respiratory failure secondary to COPD exacerbation.  High-sensitivity troponin negative x 2.  BNP 81.  Chest x-ray showed chronic interstitial lung disease.  She had symptomatic improvement with bronchodilators, steroids, and azithromycin.  She comes in accompanied by her husband today and is doing well from a cardiac perspective, without symptoms of angina or cardiac decompensation.  She continues to note longstanding exertional shortness of breath that has historically been attributed to COPD.  She also notes 2 different types of chest pain 1 described as a squeezing sensation and another that is sharp.  Pain randomly occurs, lasting for several minutes.  At times, she has taken ASA with symptom improvement.  No lower extremity swelling or progressive orthopnea.  No dizziness, presyncope, or syncope.  Reports being prescribed continuous oxygen, though typically only wears this at night and sometimes during the day.  Does not have a pulmonologist.  Without acute cardiopulmonary exacerbation at this time.  Would like to move forward with previously recommended cardiac cath.   Labs independently reviewed: 11/2022 - potassium 3.9, BUN 16, serum creatinine 0.43, Hgb 13.5, PLT 208, albumin 3.4, AST/ALT not elevated 09/2022 - TSH 0.232, free T4 low at 0.58 with TC 219, TG 58, HDL 73, LDL 134, A1c 6.2  Past Medical History:  Cardiology Office Note    Date:  12/08/2022   ID:  [name], DOB 1945/05/24, MRN [id number]  PCP:  Pcp, No  Cardiologist:  Lorine Bears, MD  Electrophysiologist:  None   Chief Complaint: Follow-up  History of Present Illness:   Madison Frank is a 77 y.o. female with history of chronic hypoxic and hypercapnic respiratory failure on supplemental oxygen at 3 L at baseline, HFrEF, COPD with frequent exacerbations, sleep apnea, migraine, osteoarthritis, and GERD who was recently admitted to [facility] for acute on chronic hypoxic respiratory failure in the setting of COPD exacerbation in 11/2022, and presents for follow-up of her cardiomyopathy.   She has frequent ED visits/admissions for COPD exacerbations. Echo through [facility] in 11/2021 (during COPD exacerbation visit) showed an EF of 55-60%, mild mitral regurgitation, trivial tricuspid regurgitation, and an estimated PASP of 33 mmHg.  She was admitted in 09/2022 with acute on chronic hypoxic and hypercapnic respiratory failure in the setting of COPD exacerbation briefly requiring BiPAP.  High-sensitivity troponin 22 with a delta and peak troponin 164.  BNP 52.  Chest x-ray showed chronic lung disease without superimposed focal airspace opacity.  EKG showed sinus tachycardia, 102 bpm, with baseline artifact and nonspecific ST-T changes.  Echo showed an EF of 35-40% with global hypokinesis with basal wall motion best preserved (unable to exclude stress-induced cardiomyopathy), Gr1DD, normal RV systolic function and ventricular cavity size, normal PASP, aortic valve sclerosis without stenosis, and an estimated right atrial pressure of 3 mmHg.  In this setting, she was evaluated by cardiology with mildly reduced LV systolic function felt to be consistent with stress-induced cardiomyopathy in the setting of COPD exacerbation.  Outpatient ischemic evaluation was recommended once she had improved from a pulmonary status.  She was seen in hospital follow-up  on 10/30/2022 and was without symptoms of angina or cardiac decompensation.  It was unclear what medication she was taking at that time.  In the setting of her cardiomyopathy, elevated high-sensitivity troponin, and multiple risk factors she was scheduled for [facility].  However, this had to be canceled due to patient illness.  Since I last saw her, she was admitted to [facility] from 10/1 through 11/24/2022 with acute on chronic hypoxic respiratory failure secondary to COPD exacerbation.  High-sensitivity troponin negative x 2.  BNP 81.  Chest x-ray showed chronic interstitial lung disease.  She had symptomatic improvement with bronchodilators, steroids, and azithromycin.  She comes in accompanied by her husband today and is doing well from a cardiac perspective, without symptoms of angina or cardiac decompensation.  She continues to note longstanding exertional shortness of breath that has historically been attributed to COPD.  She also notes 2 different types of chest pain 1 described as a squeezing sensation and another that is sharp.  Pain randomly occurs, lasting for several minutes.  At times, she has taken ASA with symptom improvement.  No lower extremity swelling or progressive orthopnea.  No dizziness, presyncope, or syncope.  Reports being prescribed continuous oxygen, though typically only wears this at night and sometimes during the day.  Does not have a pulmonologist.  Without acute cardiopulmonary exacerbation at this time.  Would like to move forward with previously recommended cardiac cath.   Labs independently reviewed: 11/2022 - potassium 3.9, BUN 16, serum creatinine 0.43, Hgb 13.5, PLT 208, albumin 3.4, AST/ALT not elevated 09/2022 - TSH 0.232, free T4 low at 0.58 with TC 219, TG 58, HDL 73, LDL 134, A1c 6.2  Past Medical History:  Cardiology Office Note    Date:  12/08/2022   ID:  [name], DOB 1945/05/24, MRN [id number]  PCP:  Pcp, No  Cardiologist:  Lorine Bears, MD  Electrophysiologist:  None   Chief Complaint: Follow-up  History of Present Illness:   Madison Frank is a 77 y.o. female with history of chronic hypoxic and hypercapnic respiratory failure on supplemental oxygen at 3 L at baseline, HFrEF, COPD with frequent exacerbations, sleep apnea, migraine, osteoarthritis, and GERD who was recently admitted to [facility] for acute on chronic hypoxic respiratory failure in the setting of COPD exacerbation in 11/2022, and presents for follow-up of her cardiomyopathy.   She has frequent ED visits/admissions for COPD exacerbations. Echo through [facility] in 11/2021 (during COPD exacerbation visit) showed an EF of 55-60%, mild mitral regurgitation, trivial tricuspid regurgitation, and an estimated PASP of 33 mmHg.  She was admitted in 09/2022 with acute on chronic hypoxic and hypercapnic respiratory failure in the setting of COPD exacerbation briefly requiring BiPAP.  High-sensitivity troponin 22 with a delta and peak troponin 164.  BNP 52.  Chest x-ray showed chronic lung disease without superimposed focal airspace opacity.  EKG showed sinus tachycardia, 102 bpm, with baseline artifact and nonspecific ST-T changes.  Echo showed an EF of 35-40% with global hypokinesis with basal wall motion best preserved (unable to exclude stress-induced cardiomyopathy), Gr1DD, normal RV systolic function and ventricular cavity size, normal PASP, aortic valve sclerosis without stenosis, and an estimated right atrial pressure of 3 mmHg.  In this setting, she was evaluated by cardiology with mildly reduced LV systolic function felt to be consistent with stress-induced cardiomyopathy in the setting of COPD exacerbation.  Outpatient ischemic evaluation was recommended once she had improved from a pulmonary status.  She was seen in hospital follow-up  on 10/30/2022 and was without symptoms of angina or cardiac decompensation.  It was unclear what medication she was taking at that time.  In the setting of her cardiomyopathy, elevated high-sensitivity troponin, and multiple risk factors she was scheduled for [facility].  However, this had to be canceled due to patient illness.  Since I last saw her, she was admitted to [facility] from 10/1 through 11/24/2022 with acute on chronic hypoxic respiratory failure secondary to COPD exacerbation.  High-sensitivity troponin negative x 2.  BNP 81.  Chest x-ray showed chronic interstitial lung disease.  She had symptomatic improvement with bronchodilators, steroids, and azithromycin.  She comes in accompanied by her husband today and is doing well from a cardiac perspective, without symptoms of angina or cardiac decompensation.  She continues to note longstanding exertional shortness of breath that has historically been attributed to COPD.  She also notes 2 different types of chest pain 1 described as a squeezing sensation and another that is sharp.  Pain randomly occurs, lasting for several minutes.  At times, she has taken ASA with symptom improvement.  No lower extremity swelling or progressive orthopnea.  No dizziness, presyncope, or syncope.  Reports being prescribed continuous oxygen, though typically only wears this at night and sometimes during the day.  Does not have a pulmonologist.  Without acute cardiopulmonary exacerbation at this time.  Would like to move forward with previously recommended cardiac cath.   Labs independently reviewed: 11/2022 - potassium 3.9, BUN 16, serum creatinine 0.43, Hgb 13.5, PLT 208, albumin 3.4, AST/ALT not elevated 09/2022 - TSH 0.232, free T4 low at 0.58 with TC 219, TG 58, HDL 73, LDL 134, A1c 6.2  Past Medical History:

## 2022-12-07 NOTE — H&P (View-Only) (Signed)
Cardiology Office Note    Date:  12/08/2022   ID:  Madison Frank, DOB 1945/05/24, MRN [id number]  PCP:  Pcp, No  Cardiologist:  Lorine Bears, MD  Electrophysiologist:  None   Chief Complaint: Follow-up  History of Present Illness:   Madison Frank is a 77 y.o. female with history of chronic hypoxic and hypercapnic respiratory failure on supplemental oxygen at 3 L at baseline, HFrEF, COPD with frequent exacerbations, sleep apnea, migraine, osteoarthritis, and GERD who was recently admitted to [facility] for acute on chronic hypoxic respiratory failure in the setting of COPD exacerbation in 11/2022, and presents for follow-up of her cardiomyopathy.   She has frequent ED visits/admissions for COPD exacerbations. Echo through [facility] in 11/2021 (during COPD exacerbation visit) showed an EF of 55-60%, mild mitral regurgitation, trivial tricuspid regurgitation, and an estimated PASP of 33 mmHg.  She was admitted in 09/2022 with acute on chronic hypoxic and hypercapnic respiratory failure in the setting of COPD exacerbation briefly requiring BiPAP.  High-sensitivity troponin 22 with a delta and peak troponin 164.  BNP 52.  Chest x-ray showed chronic lung disease without superimposed focal airspace opacity.  EKG showed sinus tachycardia, 102 bpm, with baseline artifact and nonspecific ST-T changes.  Echo showed an EF of 35-40% with global hypokinesis with basal wall motion best preserved (unable to exclude stress-induced cardiomyopathy), Gr1DD, normal RV systolic function and ventricular cavity size, normal PASP, aortic valve sclerosis without stenosis, and an estimated right atrial pressure of 3 mmHg.  In this setting, she was evaluated by cardiology with mildly reduced LV systolic function felt to be consistent with stress-induced cardiomyopathy in the setting of COPD exacerbation.  Outpatient ischemic evaluation was recommended once she had improved from a pulmonary status.  She was seen in hospital follow-up  on 10/30/2022 and was without symptoms of angina or cardiac decompensation.  It was unclear what medication she was taking at that time.  In the setting of her cardiomyopathy, elevated high-sensitivity troponin, and multiple risk factors she was scheduled for [facility].  However, this had to be canceled due to patient illness.  Since I last saw her, she was admitted to [facility] from 10/1 through 11/24/2022 with acute on chronic hypoxic respiratory failure secondary to COPD exacerbation.  High-sensitivity troponin negative x 2.  BNP 81.  Chest x-ray showed chronic interstitial lung disease.  She had symptomatic improvement with bronchodilators, steroids, and azithromycin.  She comes in accompanied by her husband today and is doing well from a cardiac perspective, without symptoms of angina or cardiac decompensation.  She continues to note longstanding exertional shortness of breath that has historically been attributed to COPD.  She also notes 2 different types of chest pain 1 described as a squeezing sensation and another that is sharp.  Pain randomly occurs, lasting for several minutes.  At times, she has taken ASA with symptom improvement.  No lower extremity swelling or progressive orthopnea.  No dizziness, presyncope, or syncope.  Reports being prescribed continuous oxygen, though typically only wears this at night and sometimes during the day.  Does not have a pulmonologist.  Without acute cardiopulmonary exacerbation at this time.  Would like to move forward with previously recommended cardiac cath.   Labs independently reviewed: 11/2022 - potassium 3.9, BUN 16, serum creatinine 0.43, Hgb 13.5, PLT 208, albumin 3.4, AST/ALT not elevated 09/2022 - TSH 0.232, free T4 low at 0.58 with TC 219, TG 58, HDL 73, LDL 134, A1c 6.2  Past Medical History:  Cardiology Office Note    Date:  12/08/2022   ID:  Madison Frank, DOB 1945/05/24, MRN [id number]  PCP:  Pcp, No  Cardiologist:  Lorine Bears, MD  Electrophysiologist:  None   Chief Complaint: Follow-up  History of Present Illness:   Madison Frank is a 77 y.o. female with history of chronic hypoxic and hypercapnic respiratory failure on supplemental oxygen at 3 L at baseline, HFrEF, COPD with frequent exacerbations, sleep apnea, migraine, osteoarthritis, and GERD who was recently admitted to [facility] for acute on chronic hypoxic respiratory failure in the setting of COPD exacerbation in 11/2022, and presents for follow-up of her cardiomyopathy.   She has frequent ED visits/admissions for COPD exacerbations. Echo through [facility] in 11/2021 (during COPD exacerbation visit) showed an EF of 55-60%, mild mitral regurgitation, trivial tricuspid regurgitation, and an estimated PASP of 33 mmHg.  She was admitted in 09/2022 with acute on chronic hypoxic and hypercapnic respiratory failure in the setting of COPD exacerbation briefly requiring BiPAP.  High-sensitivity troponin 22 with a delta and peak troponin 164.  BNP 52.  Chest x-ray showed chronic lung disease without superimposed focal airspace opacity.  EKG showed sinus tachycardia, 102 bpm, with baseline artifact and nonspecific ST-T changes.  Echo showed an EF of 35-40% with global hypokinesis with basal wall motion best preserved (unable to exclude stress-induced cardiomyopathy), Gr1DD, normal RV systolic function and ventricular cavity size, normal PASP, aortic valve sclerosis without stenosis, and an estimated right atrial pressure of 3 mmHg.  In this setting, she was evaluated by cardiology with mildly reduced LV systolic function felt to be consistent with stress-induced cardiomyopathy in the setting of COPD exacerbation.  Outpatient ischemic evaluation was recommended once she had improved from a pulmonary status.  She was seen in hospital follow-up  on 10/30/2022 and was without symptoms of angina or cardiac decompensation.  It was unclear what medication she was taking at that time.  In the setting of her cardiomyopathy, elevated high-sensitivity troponin, and multiple risk factors she was scheduled for [facility].  However, this had to be canceled due to patient illness.  Since I last saw her, she was admitted to [facility] from 10/1 through 11/24/2022 with acute on chronic hypoxic respiratory failure secondary to COPD exacerbation.  High-sensitivity troponin negative x 2.  BNP 81.  Chest x-ray showed chronic interstitial lung disease.  She had symptomatic improvement with bronchodilators, steroids, and azithromycin.  She comes in accompanied by her husband today and is doing well from a cardiac perspective, without symptoms of angina or cardiac decompensation.  She continues to note longstanding exertional shortness of breath that has historically been attributed to COPD.  She also notes 2 different types of chest pain 1 described as a squeezing sensation and another that is sharp.  Pain randomly occurs, lasting for several minutes.  At times, she has taken ASA with symptom improvement.  No lower extremity swelling or progressive orthopnea.  No dizziness, presyncope, or syncope.  Reports being prescribed continuous oxygen, though typically only wears this at night and sometimes during the day.  Does not have a pulmonologist.  Without acute cardiopulmonary exacerbation at this time.  Would like to move forward with previously recommended cardiac cath.   Labs independently reviewed: 11/2022 - potassium 3.9, BUN 16, serum creatinine 0.43, Hgb 13.5, PLT 208, albumin 3.4, AST/ALT not elevated 09/2022 - TSH 0.232, free T4 low at 0.58 with TC 219, TG 58, HDL 73, LDL 134, A1c 6.2  Past Medical History:  Cardiology Office Note    Date:  12/08/2022   ID:  Madison Frank, DOB 1945/05/24, MRN [id number]  PCP:  Pcp, No  Cardiologist:  Lorine Bears, MD  Electrophysiologist:  None   Chief Complaint: Follow-up  History of Present Illness:   Madison Frank is a 77 y.o. female with history of chronic hypoxic and hypercapnic respiratory failure on supplemental oxygen at 3 L at baseline, HFrEF, COPD with frequent exacerbations, sleep apnea, migraine, osteoarthritis, and GERD who was recently admitted to [facility] for acute on chronic hypoxic respiratory failure in the setting of COPD exacerbation in 11/2022, and presents for follow-up of her cardiomyopathy.   She has frequent ED visits/admissions for COPD exacerbations. Echo through [facility] in 11/2021 (during COPD exacerbation visit) showed an EF of 55-60%, mild mitral regurgitation, trivial tricuspid regurgitation, and an estimated PASP of 33 mmHg.  She was admitted in 09/2022 with acute on chronic hypoxic and hypercapnic respiratory failure in the setting of COPD exacerbation briefly requiring BiPAP.  High-sensitivity troponin 22 with a delta and peak troponin 164.  BNP 52.  Chest x-ray showed chronic lung disease without superimposed focal airspace opacity.  EKG showed sinus tachycardia, 102 bpm, with baseline artifact and nonspecific ST-T changes.  Echo showed an EF of 35-40% with global hypokinesis with basal wall motion best preserved (unable to exclude stress-induced cardiomyopathy), Gr1DD, normal RV systolic function and ventricular cavity size, normal PASP, aortic valve sclerosis without stenosis, and an estimated right atrial pressure of 3 mmHg.  In this setting, she was evaluated by cardiology with mildly reduced LV systolic function felt to be consistent with stress-induced cardiomyopathy in the setting of COPD exacerbation.  Outpatient ischemic evaluation was recommended once she had improved from a pulmonary status.  She was seen in hospital follow-up  on 10/30/2022 and was without symptoms of angina or cardiac decompensation.  It was unclear what medication she was taking at that time.  In the setting of her cardiomyopathy, elevated high-sensitivity troponin, and multiple risk factors she was scheduled for [facility].  However, this had to be canceled due to patient illness.  Since I last saw her, she was admitted to [facility] from 10/1 through 11/24/2022 with acute on chronic hypoxic respiratory failure secondary to COPD exacerbation.  High-sensitivity troponin negative x 2.  BNP 81.  Chest x-ray showed chronic interstitial lung disease.  She had symptomatic improvement with bronchodilators, steroids, and azithromycin.  She comes in accompanied by her husband today and is doing well from a cardiac perspective, without symptoms of angina or cardiac decompensation.  She continues to note longstanding exertional shortness of breath that has historically been attributed to COPD.  She also notes 2 different types of chest pain 1 described as a squeezing sensation and another that is sharp.  Pain randomly occurs, lasting for several minutes.  At times, she has taken ASA with symptom improvement.  No lower extremity swelling or progressive orthopnea.  No dizziness, presyncope, or syncope.  Reports being prescribed continuous oxygen, though typically only wears this at night and sometimes during the day.  Does not have a pulmonologist.  Without acute cardiopulmonary exacerbation at this time.  Would like to move forward with previously recommended cardiac cath.   Labs independently reviewed: 11/2022 - potassium 3.9, BUN 16, serum creatinine 0.43, Hgb 13.5, PLT 208, albumin 3.4, AST/ALT not elevated 09/2022 - TSH 0.232, free T4 low at 0.58 with TC 219, TG 58, HDL 73, LDL 134, A1c 6.2  Past Medical History:  Cardiology Office Note    Date:  12/08/2022   ID:  Madison Frank, DOB 1945/05/24, MRN [id number]  PCP:  Pcp, No  Cardiologist:  Lorine Bears, MD  Electrophysiologist:  None   Chief Complaint: Follow-up  History of Present Illness:   Madison Frank is a 77 y.o. female with history of chronic hypoxic and hypercapnic respiratory failure on supplemental oxygen at 3 L at baseline, HFrEF, COPD with frequent exacerbations, sleep apnea, migraine, osteoarthritis, and GERD who was recently admitted to [facility] for acute on chronic hypoxic respiratory failure in the setting of COPD exacerbation in 11/2022, and presents for follow-up of her cardiomyopathy.   She has frequent ED visits/admissions for COPD exacerbations. Echo through [facility] in 11/2021 (during COPD exacerbation visit) showed an EF of 55-60%, mild mitral regurgitation, trivial tricuspid regurgitation, and an estimated PASP of 33 mmHg.  She was admitted in 09/2022 with acute on chronic hypoxic and hypercapnic respiratory failure in the setting of COPD exacerbation briefly requiring BiPAP.  High-sensitivity troponin 22 with a delta and peak troponin 164.  BNP 52.  Chest x-ray showed chronic lung disease without superimposed focal airspace opacity.  EKG showed sinus tachycardia, 102 bpm, with baseline artifact and nonspecific ST-T changes.  Echo showed an EF of 35-40% with global hypokinesis with basal wall motion best preserved (unable to exclude stress-induced cardiomyopathy), Gr1DD, normal RV systolic function and ventricular cavity size, normal PASP, aortic valve sclerosis without stenosis, and an estimated right atrial pressure of 3 mmHg.  In this setting, she was evaluated by cardiology with mildly reduced LV systolic function felt to be consistent with stress-induced cardiomyopathy in the setting of COPD exacerbation.  Outpatient ischemic evaluation was recommended once she had improved from a pulmonary status.  She was seen in hospital follow-up  on 10/30/2022 and was without symptoms of angina or cardiac decompensation.  It was unclear what medication she was taking at that time.  In the setting of her cardiomyopathy, elevated high-sensitivity troponin, and multiple risk factors she was scheduled for [facility].  However, this had to be canceled due to patient illness.  Since I last saw her, she was admitted to [facility] from 10/1 through 11/24/2022 with acute on chronic hypoxic respiratory failure secondary to COPD exacerbation.  High-sensitivity troponin negative x 2.  BNP 81.  Chest x-ray showed chronic interstitial lung disease.  She had symptomatic improvement with bronchodilators, steroids, and azithromycin.  She comes in accompanied by her husband today and is doing well from a cardiac perspective, without symptoms of angina or cardiac decompensation.  She continues to note longstanding exertional shortness of breath that has historically been attributed to COPD.  She also notes 2 different types of chest pain 1 described as a squeezing sensation and another that is sharp.  Pain randomly occurs, lasting for several minutes.  At times, she has taken ASA with symptom improvement.  No lower extremity swelling or progressive orthopnea.  No dizziness, presyncope, or syncope.  Reports being prescribed continuous oxygen, though typically only wears this at night and sometimes during the day.  Does not have a pulmonologist.  Without acute cardiopulmonary exacerbation at this time.  Would like to move forward with previously recommended cardiac cath.   Labs independently reviewed: 11/2022 - potassium 3.9, BUN 16, serum creatinine 0.43, Hgb 13.5, PLT 208, albumin 3.4, AST/ALT not elevated 09/2022 - TSH 0.232, free T4 low at 0.58 with TC 219, TG 58, HDL 73, LDL 134, A1c 6.2  Past Medical History:  Cardiology Office Note    Date:  12/08/2022   ID:  Madison Frank, DOB 1945/05/24, MRN [id number]  PCP:  Pcp, No  Cardiologist:  Lorine Bears, MD  Electrophysiologist:  None   Chief Complaint: Follow-up  History of Present Illness:   Madison Frank is a 77 y.o. female with history of chronic hypoxic and hypercapnic respiratory failure on supplemental oxygen at 3 L at baseline, HFrEF, COPD with frequent exacerbations, sleep apnea, migraine, osteoarthritis, and GERD who was recently admitted to [facility] for acute on chronic hypoxic respiratory failure in the setting of COPD exacerbation in 11/2022, and presents for follow-up of her cardiomyopathy.   She has frequent ED visits/admissions for COPD exacerbations. Echo through [facility] in 11/2021 (during COPD exacerbation visit) showed an EF of 55-60%, mild mitral regurgitation, trivial tricuspid regurgitation, and an estimated PASP of 33 mmHg.  She was admitted in 09/2022 with acute on chronic hypoxic and hypercapnic respiratory failure in the setting of COPD exacerbation briefly requiring BiPAP.  High-sensitivity troponin 22 with a delta and peak troponin 164.  BNP 52.  Chest x-ray showed chronic lung disease without superimposed focal airspace opacity.  EKG showed sinus tachycardia, 102 bpm, with baseline artifact and nonspecific ST-T changes.  Echo showed an EF of 35-40% with global hypokinesis with basal wall motion best preserved (unable to exclude stress-induced cardiomyopathy), Gr1DD, normal RV systolic function and ventricular cavity size, normal PASP, aortic valve sclerosis without stenosis, and an estimated right atrial pressure of 3 mmHg.  In this setting, she was evaluated by cardiology with mildly reduced LV systolic function felt to be consistent with stress-induced cardiomyopathy in the setting of COPD exacerbation.  Outpatient ischemic evaluation was recommended once she had improved from a pulmonary status.  She was seen in hospital follow-up  on 10/30/2022 and was without symptoms of angina or cardiac decompensation.  It was unclear what medication she was taking at that time.  In the setting of her cardiomyopathy, elevated high-sensitivity troponin, and multiple risk factors she was scheduled for [facility].  However, this had to be canceled due to patient illness.  Since I last saw her, she was admitted to [facility] from 10/1 through 11/24/2022 with acute on chronic hypoxic respiratory failure secondary to COPD exacerbation.  High-sensitivity troponin negative x 2.  BNP 81.  Chest x-ray showed chronic interstitial lung disease.  She had symptomatic improvement with bronchodilators, steroids, and azithromycin.  She comes in accompanied by her husband today and is doing well from a cardiac perspective, without symptoms of angina or cardiac decompensation.  She continues to note longstanding exertional shortness of breath that has historically been attributed to COPD.  She also notes 2 different types of chest pain 1 described as a squeezing sensation and another that is sharp.  Pain randomly occurs, lasting for several minutes.  At times, she has taken ASA with symptom improvement.  No lower extremity swelling or progressive orthopnea.  No dizziness, presyncope, or syncope.  Reports being prescribed continuous oxygen, though typically only wears this at night and sometimes during the day.  Does not have a pulmonologist.  Without acute cardiopulmonary exacerbation at this time.  Would like to move forward with previously recommended cardiac cath.   Labs independently reviewed: 11/2022 - potassium 3.9, BUN 16, serum creatinine 0.43, Hgb 13.5, PLT 208, albumin 3.4, AST/ALT not elevated 09/2022 - TSH 0.232, free T4 low at 0.58 with TC 219, TG 58, HDL 73, LDL 134, A1c 6.2  Past Medical History:

## 2022-12-08 ENCOUNTER — Ambulatory Visit: Payer: 59 | Attending: Physician Assistant | Admitting: Physician Assistant

## 2022-12-08 ENCOUNTER — Encounter: Payer: Self-pay | Admitting: Physician Assistant

## 2022-12-08 VITALS — BP 128/77 | HR 82 | Ht 64.0 in | Wt 114.2 lb

## 2022-12-08 DIAGNOSIS — R7989 Other specified abnormal findings of blood chemistry: Secondary | ICD-10-CM | POA: Diagnosis not present

## 2022-12-08 DIAGNOSIS — J9611 Chronic respiratory failure with hypoxia: Secondary | ICD-10-CM | POA: Diagnosis not present

## 2022-12-08 DIAGNOSIS — J449 Chronic obstructive pulmonary disease, unspecified: Secondary | ICD-10-CM | POA: Diagnosis not present

## 2022-12-08 DIAGNOSIS — I5020 Unspecified systolic (congestive) heart failure: Secondary | ICD-10-CM | POA: Diagnosis not present

## 2022-12-08 NOTE — Patient Instructions (Addendum)
Medication Instructions:  - Your physician recommends that you continue on your current medications as directed. Please refer to the Current Medication list given to you today.  *If you need a refill on your cardiac medications before your next appointment, please call your pharmacy*   Lab Work: - Prior to Cardiac Cath If you have labs (blood work) drawn today and your tests are completely normal, you will receive your results only by: MyChart Message (if you have MyChart) OR A paper copy in the mail If you have any lab test that is abnormal or we need to change your treatment, we will call you to review the results.   Testing/Procedures:  1) You have been referred to [facility] for COPD Their office will contact your directly with an appointment. If you do not hear from them within a week, please call their office directly at [phone].    2) Cardiac Catheterization:  -Your physician has requested that you have a cardiac catheterization. Cardiac catheterization is used to diagnose and/or treat various heart conditions. Doctors may recommend this procedure for a number of different reasons. The most common reason is to evaluate chest pain. Chest pain can be a symptom of coronary artery disease (CAD), and cardiac catheterization can show whether plaque is narrowing or blocking your heart's arteries. This procedure is also used to evaluate the valves, as well as measure the blood flow and oxygen levels in different parts of your heart.     [facility] [address] [name], [address] [place] [address] Dept: [phone] Loc: [phone]  Madison Frank  12/08/2022  You are scheduled for a Cardiac Catheterization on ____________, October ___________ with Dr. Lorine Frank.  1. Please arrive at the [facility]ascular Center Entrance of [facility], [address], [place]  [address] at ____________(This is 1 hour(s) prior to your procedure time).  Proceed to the Check-In Desk directly inside the entrance.  Procedure Parking: Use the entrance off of the [address] side of the hospital. Turn right upon entering and follow the driveway to parking that is directly in front of the Heart & Vascular Center. There is no valet parking available at this entrance, however there is an awning directly in front of the Heart & Vascular Center for drop off/ pick up for patients.  Special note: Every effort is made to have your procedure done on time. Please understand that emergencies sometimes delay scheduled procedures.  2. Diet: Do not eat solid foods after midnight.  The patient may have clear liquids until 5am upon the day of the procedure.  3. Labs: You will need to have blood drawn prior to your cardiac cath  4. Medication instructions in preparation for your procedure:   Contrast Allergy: No    On the morning of your procedure, take your Aspirin 81 mg and any morning medicines NOT listed above.  You may use sips of water.  5. Plan to go home the same day, you will only stay overnight if medically necessary. 6. Bring a current list of your medications and current insurance cards. 7. You MUST have a responsible person to drive you home. 8. Someone MUST be with you the first 24 hours after you arrive home or your discharge will be delayed. 9. Please wear clothes that are easy to get on and off and wear slip-on shoes.  Thank you for allowing [place] to  care for you!   -- Madison Frank [facility]    Follow-Up: At [facility], you and your health needs are our priority.  As part of our continuing mission to provide you with exceptional heart care, we have created designated Provider Care Teams.  These Care Teams include your primary Cardiologist (physician) and Advanced Practice Providers (APPs -  Physician Assistants and Nurse Practitioners)  who all work together to provide you with the care you need, when you need it.  We recommend signing up for the patient portal called "MyChart".  Sign up information is provided on this After Visit Summary.  MyChart is used to connect with patients for Virtual Visits (Telemedicine).  Patients are able to view lab/test results, encounter notes, upcoming appointments, etc.  Non-urgent messages can be sent to your provider as well.   To learn more about what you can do with MyChart, go to [email or web address].    Your next appointment:   3 week(s)  Provider:   You may see Madison Bears, MD or one of the following Advanced Practice Providers on your designated Care Team:    Eula Listen, PA-C    Other Instructions Coronary Angiogram A coronary angiogram is an X-ray procedure that is used to examine the arteries in the heart. Contrast dye is injected through a soft tube (catheter) into these arteries. Then X-rays are taken to show any blockage in these arteries. You may have this procedure if: You have chest pain, or other symptoms of angina, and you are at risk for heart disease. You have an abnormal stress test or test of your heart's electrical activity (electrocardiogram, or ECG). You have chest pain and heart failure. You have irregular heart rhythms. You have valvular heart disease. A coronary angiogram or heart catheterization can show if you have disease of the aorta. This procedure can also be used to check the overall function of your heart muscle. Let your health care provider know about: Any allergies you have, including allergies to medicines or contrast dye. All medicines you are taking, including vitamins, herbs, eye drops, creams, and over-the-counter medicines. Any problems you or family members have had with anesthesia. Any bleeding problems you have. Any surgeries you have had. Any history of kidney problems or kidney failure. Any medical conditions you have or have had,  including febrile illness or untreated infection. Whether you are pregnant or may be pregnant. Whether you are breastfeeding. Any anxiety disorders, chronic pain, or other conditions you have that may increase your stress or prevent you from lying still. What are the risks? Your health care provider will talk with you about risks. These may include: Infection. Allergic reaction to medicines or dyes that are used. Bleeding from the insertion site or other places. Damage to nearby structures, such as blood vessels, or damage to kidneys from contrast dye. Irregular heart rhythms. Stroke. This is rare. Heart attack. This is rare. Death. This is rare. What happens before the procedure? When to stop eating and drinking Follow instructions from your provider about what you may eat and drink before your procedure. These may include: 8 hours before your procedure Stop eating most foods. Do not eat meat, fried foods, or fatty foods. Eat only light foods, such as toast or crackers. All liquids are okay except energy drinks and alcohol. 6 hours before your procedure Stop eating. Drink only clear liquids, such as water, clear fruit juice, black coffee, plain tea, and sports drinks. Do not drink energy drinks or  alcohol. 2 hours before your procedure Stop drinking all liquids. You may be allowed to take medicines with small sips of water. If you do not follow your provider's instructions, your procedure may be delayed or canceled. Medicines Ask your provider about: Changing or stopping your regular medicines. These include any diabetes medicines or blood thinners you take. Taking medicines such as aspirin and ibuprofen. These medicines can thin your blood. Do not take them unless your provider tells you to. Taking over-the-counter medicines, vitamins, herbs, and supplements. Surgery safety Ask your provider: How your insertion site will be marked. What steps will be taken to help prevent  infection. These may include: Removing hair at the insertion site. Washing skin with a soap that kills germs. Taking antibiotic medicine. General instructions Do not use any products that contain nicotine or tobacco for at least 4 weeks before the procedure. These products include cigarettes, chewing tobacco, and vaping devices, such as e-cigarettes. If you need help quitting, ask your provider. If you will be going home right after the procedure, plan to have a responsible adult: Take you home from the hospital or clinic. You will not be allowed to drive. Care for you for the time you are told. What happens during the procedure?  You will lie on your back on an X-ray table. An IV will be inserted into one of your veins. Electrodes will be placed on your chest. You may be given: A sedative. This helps you relax. Anesthesia. This will numb the catheter insertion area. You will be connected to a continuous ECG monitor. The catheter will be inserted into an artery in one of these areas: Your groin area in your upper thigh (femoral artery). Your wrist (radial artery). An X-ray procedure (fluoroscopy) will be used to help guide the catheter to the opening of the blood vessel to be used. A dye will be injected into the catheter and X-rays will be taken. The dye will help to show any narrowing or blockages in the arteries. Tell your provider if you have chest pain or trouble breathing. If blockages are found, another procedure may be done to open the artery. The catheter will be removed after the fluoroscopy is complete. A closure device may be placed in the artery to help stop bleeding. A bandage (dressing) will be placed over the insertion site. Pressure will be applied to stop bleeding. The procedure may vary among health care providers and hospitals. What happens after the procedure? Your blood pressure, heart rate, breathing rate, and blood oxygen level will be monitored until you leave  the hospital or clinic. You will need to lie still for a few hours, or for as long as told by your provider. If the procedure is done through the groin, you will be told not to bend or cross your legs. The insertion site and the pulse in your foot or wrist will be checked often. You may have more blood tests, X-rays, or ECG. If you were given a sedative, do not drive or operate machinery until your provider says that it is safe. You may have to avoid lifting. Ask your provider how much you can safely lift. This information is not intended to replace advice given to you by your health care provider. Make sure you discuss any questions you have with your health care provider. Document Revised: 09/09/2021 Document Reviewed: 09/09/2021 Elsevier Patient Education  2024 [employer].

## 2022-12-11 ENCOUNTER — Encounter: Payer: Self-pay | Admitting: Emergency Medicine

## 2022-12-11 ENCOUNTER — Telehealth: Payer: Self-pay | Admitting: Physician Assistant

## 2022-12-11 NOTE — Telephone Encounter (Signed)
The patient was seen in the office late on Friday 12/08/22 with Madison Listen, PA. A right & left heart cath was ordered.  The patient was advised I would call her back at the beginning of this week with an appointment date/ time due to scheduling being closed at the time of her appointment on Friday.  I have spoken with scheduling at [facility] and have the patient scheduled for a right & left heart cath with Dr. Kirke Corin on Friday 12/22/22 at 7:30 am (arrive at 6:30 am).  Attempted to contact the patient to discuss. No answer- I left a message to please call back.     [name] [address] [place] Frank, [address] [place] [address] Dept: [phone] Loc: [phone]  [name]  12/11/2022  You are scheduled for a Cardiac Catheterization on Friday, November 1 with Dr. Lorine Bears.  1. Please arrive at the [facility]ascular Center Entrance of [facility], [address] [place], [place] [address] at 6:30 AM (This is 1 hour(s) prior to your procedure time).  Proceed to the Check-In Desk directly inside the entrance.  Procedure Parking: Use the entrance off of the [address] side of the hospital. Turn right upon entering and follow the driveway to parking that is directly in front of the Heart & Vascular Center. There is no valet parking available at this entrance, however there is an awning directly in front of the Heart & Vascular Center for drop off/ pick up for patients.  Special note: Every effort is made to have your procedure done on time. Please understand that emergencies sometimes delay scheduled procedures.  2. Diet: Do not eat solid foods after midnight.  You may have clear liquids until 5am upon the day of the procedure.  3. Labs: BMP/ CBC done on 11/24/22 (no additional labs needed)   4. Medication instructions in preparation for your procedure:   Contrast Allergy: No   On the morning  of your procedure, take your Aspirin 81 mg and any morning medicines NOT listed above.  You may use sips of water.  5. Plan to go home the same day, you will only stay overnight if medically necessary. 6. Bring a current list of your medications and current insurance cards. 7. You MUST have a responsible person to drive you home. 8. Someone MUST be with you the first 24 hours after you arrive home or your discharge will be delayed. 9. Please wear clothes that are easy to get on and off and wear slip-on shoes.  Thank you for allowing [place] to care for you!   -- [place] [facility]

## 2022-12-11 NOTE — Telephone Encounter (Signed)
Pt called and message left for pt to call back -- letter for update on pt's scheduled cath to be mailed to pt today

## 2022-12-13 ENCOUNTER — Telehealth: Payer: Self-pay | Admitting: Physician Assistant

## 2022-12-13 DIAGNOSIS — E785 Hyperlipidemia, unspecified: Secondary | ICD-10-CM | POA: Diagnosis not present

## 2022-12-13 DIAGNOSIS — Z87891 Personal history of nicotine dependence: Secondary | ICD-10-CM | POA: Diagnosis not present

## 2022-12-13 DIAGNOSIS — Z9181 History of falling: Secondary | ICD-10-CM | POA: Diagnosis not present

## 2022-12-13 DIAGNOSIS — Z7952 Long term (current) use of systemic steroids: Secondary | ICD-10-CM | POA: Diagnosis not present

## 2022-12-13 DIAGNOSIS — K219 Gastro-esophageal reflux disease without esophagitis: Secondary | ICD-10-CM | POA: Diagnosis not present

## 2022-12-13 DIAGNOSIS — J441 Chronic obstructive pulmonary disease with (acute) exacerbation: Secondary | ICD-10-CM | POA: Diagnosis not present

## 2022-12-13 DIAGNOSIS — I10 Essential (primary) hypertension: Secondary | ICD-10-CM | POA: Diagnosis not present

## 2022-12-13 DIAGNOSIS — J9621 Acute and chronic respiratory failure with hypoxia: Secondary | ICD-10-CM | POA: Diagnosis not present

## 2022-12-13 DIAGNOSIS — Z9981 Dependence on supplemental oxygen: Secondary | ICD-10-CM | POA: Diagnosis not present

## 2022-12-13 NOTE — Telephone Encounter (Signed)
Attempted to call patient. Unable to leave message. Could not call spouse as there is no active DPR on file.

## 2022-12-13 NOTE — Telephone Encounter (Signed)
Pt called and unable to leave message on cell phone (not setup) and another message left on voicemail for call back

## 2022-12-13 NOTE — Telephone Encounter (Signed)
*  STAT* If patient is at the pharmacy, call can be transferred to refill team.   1. Which medications need to be refilled? (please list name of each medication and dose if known)   predniSONE (DELTASONE) 10 MG tablet     4. Which pharmacy/location (including street and city if local pharmacy) is medication to be sent to? [facility] - [place] - [address]     5. Do they need a 30 day or 90 day supply? 90

## 2022-12-14 NOTE — Telephone Encounter (Signed)
The patient has been called and left third message for call back to notify pt of scheduled heart cath

## 2022-12-15 ENCOUNTER — Telehealth: Payer: Self-pay

## 2022-12-15 NOTE — Telephone Encounter (Signed)
Spoke to patient and informed her of the instructions for heart cath scheduled 12/22/22. Gave patient information listed below:  patient scheduled for a right & left heart cath with Dr. Kirke Corin on Friday 12/22/22 at 7:30 am (arrive at 6:30 am).  [facility] [address] [name], [address] [place] [address] Dept: [phone] Loc: [phone]   NALIN MULLINEAUX             12/11/2022   You are scheduled for a Cardiac Catheterization on Friday, November 1 with Dr. Lorine Bears.   1. Please arrive at the [facility] Center Entrance of [facility], [address] [place], [place] [address] at 6:30 AM (This is 1 hour(s) prior to your procedure time).  Proceed to the Check-In Desk directly inside the entrance.   Procedure Parking: Use the entrance off of the [address] side of the hospital. Turn right upon entering and follow the driveway to parking that is directly in front of the Heart & Vascular Center. There is no valet parking available at this entrance, however there is an awning directly in front of the Heart & Vascular Center for drop off/ pick up for patients.   Special note: Every effort is made to have your procedure done on time. Please understand that emergencies sometimes delay scheduled procedures.   2. Diet: Do not eat solid foods after midnight.  You may have clear liquids until 5am upon the day of the procedure.   3. Labs: BMP/ CBC done on 11/24/22 (no additional labs needed)    4. Medication instructions in preparation for your procedure:    Contrast Allergy: No     On the morning of your procedure, take your Aspirin 81 mg and any morning medicines NOT listed above.  You may use sips of water.   5. Plan to go home the same day, you will only stay overnight if medically necessary. 6. Bring a current list of your medications and current insurance cards. 7. You MUST have a  responsible person to drive you home. 8. Someone MUST be with you the first 24 hours after you arrive home or your discharge will be delayed. 9. Please wear clothes that are easy to get on and off and wear slip-on shoes.   Thank you for allowing [place] to care for you!   -- [place] [facility]

## 2022-12-18 DIAGNOSIS — Z7952 Long term (current) use of systemic steroids: Secondary | ICD-10-CM | POA: Diagnosis not present

## 2022-12-18 DIAGNOSIS — J449 Chronic obstructive pulmonary disease, unspecified: Secondary | ICD-10-CM | POA: Diagnosis not present

## 2022-12-18 DIAGNOSIS — K219 Gastro-esophageal reflux disease without esophagitis: Secondary | ICD-10-CM | POA: Diagnosis not present

## 2022-12-18 DIAGNOSIS — Z9981 Dependence on supplemental oxygen: Secondary | ICD-10-CM | POA: Diagnosis not present

## 2022-12-18 DIAGNOSIS — I10 Essential (primary) hypertension: Secondary | ICD-10-CM | POA: Diagnosis not present

## 2022-12-18 DIAGNOSIS — J441 Chronic obstructive pulmonary disease with (acute) exacerbation: Secondary | ICD-10-CM | POA: Diagnosis not present

## 2022-12-18 DIAGNOSIS — J9621 Acute and chronic respiratory failure with hypoxia: Secondary | ICD-10-CM | POA: Diagnosis not present

## 2022-12-18 DIAGNOSIS — Z87891 Personal history of nicotine dependence: Secondary | ICD-10-CM | POA: Diagnosis not present

## 2022-12-18 DIAGNOSIS — E785 Hyperlipidemia, unspecified: Secondary | ICD-10-CM | POA: Diagnosis not present

## 2022-12-18 DIAGNOSIS — Z9181 History of falling: Secondary | ICD-10-CM | POA: Diagnosis not present

## 2022-12-18 NOTE — Telephone Encounter (Signed)
Patient notified of upcoming heart cath. See 12/15/22 phone note.

## 2022-12-20 DIAGNOSIS — R0902 Hypoxemia: Secondary | ICD-10-CM | POA: Diagnosis not present

## 2022-12-20 DIAGNOSIS — J439 Emphysema, unspecified: Secondary | ICD-10-CM | POA: Diagnosis not present

## 2022-12-22 ENCOUNTER — Other Ambulatory Visit: Payer: Self-pay

## 2022-12-22 ENCOUNTER — Encounter: Payer: Self-pay | Admitting: Cardiovascular Disease

## 2022-12-22 ENCOUNTER — Ambulatory Visit
Admission: RE | Admit: 2022-12-22 | Discharge: 2022-12-22 | Disposition: A | Discharge status: Home / Self Care | Payer: 59 | Attending: Cardiovascular Disease | Admitting: Cardiovascular Disease

## 2022-12-22 ENCOUNTER — Encounter
Admission: RE | Disposition: A | Discharge status: Home / Self Care | Payer: Self-pay | Source: Home / Self Care | Attending: Cardiovascular Disease

## 2022-12-22 DIAGNOSIS — Z7982 Long term (current) use of aspirin: Secondary | ICD-10-CM | POA: Diagnosis not present

## 2022-12-22 DIAGNOSIS — J441 Chronic obstructive pulmonary disease with (acute) exacerbation: Secondary | ICD-10-CM | POA: Diagnosis not present

## 2022-12-22 DIAGNOSIS — K219 Gastro-esophageal reflux disease without esophagitis: Secondary | ICD-10-CM | POA: Insufficient documentation

## 2022-12-22 DIAGNOSIS — Z79899 Other long term (current) drug therapy: Secondary | ICD-10-CM | POA: Diagnosis not present

## 2022-12-22 DIAGNOSIS — I429 Cardiomyopathy, unspecified: Secondary | ICD-10-CM | POA: Diagnosis not present

## 2022-12-22 DIAGNOSIS — G4730 Sleep apnea, unspecified: Secondary | ICD-10-CM | POA: Insufficient documentation

## 2022-12-22 DIAGNOSIS — I25118 Atherosclerotic heart disease of native coronary artery with other forms of angina pectoris: Secondary | ICD-10-CM | POA: Insufficient documentation

## 2022-12-22 DIAGNOSIS — J9611 Chronic respiratory failure with hypoxia: Secondary | ICD-10-CM | POA: Insufficient documentation

## 2022-12-22 DIAGNOSIS — I5021 Acute systolic (congestive) heart failure: Secondary | ICD-10-CM

## 2022-12-22 DIAGNOSIS — I5023 Acute on chronic systolic (congestive) heart failure: Secondary | ICD-10-CM | POA: Diagnosis not present

## 2022-12-22 DIAGNOSIS — I2510 Atherosclerotic heart disease of native coronary artery without angina pectoris: Secondary | ICD-10-CM | POA: Insufficient documentation

## 2022-12-22 DIAGNOSIS — Z9981 Dependence on supplemental oxygen: Secondary | ICD-10-CM | POA: Diagnosis not present

## 2022-12-22 HISTORY — PX: RIGHT/LEFT HEART CATH AND CORONARY ANGIOGRAPHY: CATH118266

## 2022-12-22 LAB — POCT I-STAT EG7
Acid-Base Excess: 0 mmol/L (ref 0.0–2.0)
Acid-Base Excess: 0 mmol/L (ref 0.0–2.0)
Acid-Base Excess: 0 mmol/L (ref 0.0–2.0)
Bicarbonate: 26.9 mmol/L (ref 20.0–28.0)
Bicarbonate: 27.2 mmol/L (ref 20.0–28.0)
Bicarbonate: 27.2 mmol/L (ref 20.0–28.0)
Calcium, Ion: 1.15 mmol/L (ref 1.15–1.40)
Calcium, Ion: 1.16 mmol/L (ref 1.15–1.40)
Calcium, Ion: 1.17 mmol/L (ref 1.15–1.40)
HCT: 35 % — ABNORMAL LOW (ref 36.0–46.0)
HCT: 35 % — ABNORMAL LOW (ref 36.0–46.0)
HCT: 35 % — ABNORMAL LOW (ref 36.0–46.0)
Hemoglobin: 11.9 g/dL — ABNORMAL LOW (ref 12.0–15.0)
Hemoglobin: 11.9 g/dL — ABNORMAL LOW (ref 12.0–15.0)
Hemoglobin: 11.9 g/dL — ABNORMAL LOW (ref 12.0–15.0)
O2 Saturation: 82 %
O2 Saturation: 85 %
O2 Saturation: 87 %
Potassium: 3.1 mmol/L — ABNORMAL LOW (ref 3.5–5.1)
Potassium: 3.1 mmol/L — ABNORMAL LOW (ref 3.5–5.1)
Potassium: 3.3 mmol/L — ABNORMAL LOW (ref 3.5–5.1)
Sodium: 141 mmol/L (ref 135–145)
Sodium: 141 mmol/L (ref 135–145)
Sodium: 141 mmol/L (ref 135–145)
TCO2: 29 mmol/L (ref 22–32)
TCO2: 29 mmol/L (ref 22–32)
TCO2: 29 mmol/L (ref 22–32)
pCO2, Ven: 53.5 mm[Hg] (ref 44–60)
pCO2, Ven: 54 mm[Hg] (ref 44–60)
pCO2, Ven: 55.3 mm[Hg] (ref 44–60)
pH, Ven: 7.299 (ref 7.25–7.43)
pH, Ven: 7.31 (ref 7.25–7.43)
pH, Ven: 7.311 (ref 7.25–7.43)
pO2, Ven: 52 mm[Hg] — ABNORMAL HIGH (ref 32–45)
pO2, Ven: 56 mm[Hg] — ABNORMAL HIGH (ref 32–45)
pO2, Ven: 59 mm[Hg] — ABNORMAL HIGH (ref 32–45)

## 2022-12-22 LAB — POCT I-STAT 7, (LYTES, BLD GAS, ICA,H+H)
Acid-base deficit: 1 mmol/L (ref 0.0–2.0)
Bicarbonate: 26.4 mmol/L (ref 20.0–28.0)
Calcium, Ion: 1.12 mmol/L — ABNORMAL LOW (ref 1.15–1.40)
HCT: 35 % — ABNORMAL LOW (ref 36.0–46.0)
Hemoglobin: 11.9 g/dL — ABNORMAL LOW (ref 12.0–15.0)
O2 Saturation: 98 %
Potassium: 3 mmol/L — ABNORMAL LOW (ref 3.5–5.1)
Sodium: 142 mmol/L (ref 135–145)
TCO2: 28 mmol/L (ref 22–32)
pCO2 arterial: 52.7 mm[Hg] — ABNORMAL HIGH (ref 32–48)
pH, Arterial: 7.309 — ABNORMAL LOW (ref 7.35–7.45)
pO2, Arterial: 127 mm[Hg] — ABNORMAL HIGH (ref 83–108)

## 2022-12-22 LAB — POCT ACTIVATED CLOTTING TIME: Activated Clotting Time: 0 s

## 2022-12-22 SURGERY — RIGHT/LEFT HEART CATH AND CORONARY ANGIOGRAPHY
Anesthesia: Moderate Sedation | Laterality: Bilateral

## 2022-12-22 MED ORDER — FENTANYL CITRATE (PF) 100 MCG/2ML IJ SOLN
INTRAMUSCULAR | Status: DC | PRN
  Administered 2022-12-22: 25 ug via INTRAVENOUS

## 2022-12-22 MED ORDER — SODIUM CHLORIDE 0.9 % IV SOLN: 250.0000 mL | INTRAVENOUS | Status: DC | PRN

## 2022-12-22 MED ORDER — IOHEXOL 300 MG/ML  SOLN
INTRAMUSCULAR | Status: DC | PRN
  Administered 2022-12-22: 26 mL

## 2022-12-22 MED ORDER — ASPIRIN 81 MG PO CHEW
CHEWABLE_TABLET | ORAL | Status: AC
  Filled 2022-12-22: qty 1

## 2022-12-22 MED ORDER — VERAPAMIL HCL 2.5 MG/ML IV SOLN
INTRAVENOUS | Status: DC | PRN
  Administered 2022-12-22: 2.5 mg via INTRA_ARTERIAL

## 2022-12-22 MED ORDER — ONDANSETRON HCL 4 MG/2ML IJ SOLN: 4.0000 mg | Freq: Four times a day (QID) | INTRAMUSCULAR | Status: DC | PRN

## 2022-12-22 MED ORDER — HEPARIN (PORCINE) IN NACL 1000-0.9 UT/500ML-% IV SOLN
INTRAVENOUS | Status: DC | PRN
  Administered 2022-12-22 (×2): 500 mL

## 2022-12-22 MED ORDER — HEPARIN SODIUM (PORCINE) 1000 UNIT/ML IJ SOLN
INTRAMUSCULAR | Status: AC
  Filled 2022-12-22: qty 10

## 2022-12-22 MED ORDER — ASPIRIN 81 MG PO CHEW
81.0000 mg | CHEWABLE_TABLET | ORAL | Status: AC
  Administered 2022-12-22: 81 mg via ORAL

## 2022-12-22 MED ORDER — VERAPAMIL HCL 2.5 MG/ML IV SOLN
INTRAVENOUS | Status: AC
Start: 2022-12-22 — End: ?
  Filled 2022-12-22: qty 2

## 2022-12-22 MED ORDER — HEPARIN SODIUM (PORCINE) 1000 UNIT/ML IJ SOLN
INTRAMUSCULAR | Status: DC | PRN
  Administered 2022-12-22: 2500 [IU] via INTRAVENOUS

## 2022-12-22 MED ORDER — ACETAMINOPHEN 325 MG PO TABS
650.0000 mg | ORAL_TABLET | ORAL | Status: DC | PRN
Start: 2022-12-22 — End: 2022-12-22

## 2022-12-22 MED ORDER — HEPARIN (PORCINE) IN NACL 1000-0.9 UT/500ML-% IV SOLN
INTRAVENOUS | Status: AC
  Filled 2022-12-22: qty 1000

## 2022-12-22 MED ORDER — SODIUM CHLORIDE 0.9% FLUSH: 3.0000 mL | Freq: Two times a day (BID) | INTRAVENOUS | Status: DC

## 2022-12-22 MED ORDER — SODIUM CHLORIDE 0.9% FLUSH: 3.0000 mL | INTRAVENOUS | Status: DC | PRN

## 2022-12-22 MED ORDER — MIDAZOLAM HCL 2 MG/2ML IJ SOLN
INTRAMUSCULAR | Status: DC | PRN
  Administered 2022-12-22: 1 mg via INTRAVENOUS

## 2022-12-22 MED ORDER — SODIUM CHLORIDE 0.9 % IV SOLN: INTRAVENOUS | Status: DC

## 2022-12-22 MED ORDER — FENTANYL CITRATE (PF) 100 MCG/2ML IJ SOLN
INTRAMUSCULAR | Status: AC
  Filled 2022-12-22: qty 2

## 2022-12-22 MED ORDER — MIDAZOLAM HCL 2 MG/2ML IJ SOLN
INTRAMUSCULAR | Status: AC
  Filled 2022-12-22: qty 2

## 2022-12-22 SURGICAL SUPPLY — 15 items
CATH BALLN WEDGE 5F 110CM (CATHETERS) IMPLANT
CATH INFINITI AMBI 5FR JK (CATHETERS) IMPLANT
DEVICE RAD TR BAND REGULAR (VASCULAR PRODUCTS) IMPLANT
DRAPE BRACHIAL (DRAPES) IMPLANT
GLIDESHEATH SLEND SS 6F .021 (SHEATH) IMPLANT
GUIDEWIRE .025 260CM (WIRE) IMPLANT
GUIDEWIRE EMER 3M J .025X150CM (WIRE) IMPLANT
GUIDEWIRE INQWIRE 1.5J.035X260 (WIRE) IMPLANT
INQWIRE 1.5J .035X260CM (WIRE) ×1
KIT SYRINGE INJ CVI SPIKEX1 (MISCELLANEOUS) IMPLANT
PACK CARDIAC CATH (CUSTOM PROCEDURE TRAY) ×1 IMPLANT
PROTECTION STATION PRESSURIZED (MISCELLANEOUS) ×1
SET ATX-X65L (MISCELLANEOUS) IMPLANT
SHEATH GLIDE SLENDER 4/5FR (SHEATH) IMPLANT
STATION PROTECTION PRESSURIZED (MISCELLANEOUS) IMPLANT

## 2022-12-22 NOTE — Interval H&P Note (Signed)
History and Physical Interval Note:  12/22/2022 8:00 AM  Madison Frank  has presented today for surgery, with the diagnosis of R and L Cath   HF w reduced EF.  The various methods of treatment have been discussed with the patient and family. After consideration of risks, benefits and other options for treatment, the patient has consented to  Procedure(s): RIGHT/LEFT HEART CATH AND CORONARY ANGIOGRAPHY (Bilateral) as a surgical intervention.  The patient's history has been reviewed, patient examined, no change in status, stable for surgery.  I have reviewed the patient's chart and labs.  Questions were answered to the patient's satisfaction.     Lorine Bears

## 2022-12-23 ENCOUNTER — Emergency Department
Admission: EM | Admit: 2022-12-23 | Discharge: 2023-01-21 | Disposition: E | Payer: 59 | Attending: Emergency Medicine | Admitting: Emergency Medicine

## 2022-12-23 DIAGNOSIS — R6889 Other general symptoms and signs: Secondary | ICD-10-CM | POA: Diagnosis not present

## 2022-12-23 DIAGNOSIS — I469 Cardiac arrest, cause unspecified: Secondary | ICD-10-CM | POA: Insufficient documentation

## 2022-12-23 DIAGNOSIS — I499 Cardiac arrhythmia, unspecified: Secondary | ICD-10-CM | POA: Diagnosis not present

## 2022-12-23 DIAGNOSIS — T797XXA Traumatic subcutaneous emphysema, initial encounter: Secondary | ICD-10-CM | POA: Diagnosis not present

## 2022-12-23 DIAGNOSIS — Z743 Need for continuous supervision: Secondary | ICD-10-CM | POA: Diagnosis not present

## 2022-12-23 DIAGNOSIS — J449 Chronic obstructive pulmonary disease, unspecified: Secondary | ICD-10-CM | POA: Insufficient documentation

## 2022-12-23 DIAGNOSIS — J939 Pneumothorax, unspecified: Secondary | ICD-10-CM | POA: Diagnosis not present

## 2022-12-23 DIAGNOSIS — R404 Transient alteration of awareness: Secondary | ICD-10-CM | POA: Diagnosis not present

## 2022-12-23 DIAGNOSIS — Z4682 Encounter for fitting and adjustment of non-vascular catheter: Secondary | ICD-10-CM | POA: Diagnosis not present

## 2022-12-23 DIAGNOSIS — R092 Respiratory arrest: Secondary | ICD-10-CM | POA: Diagnosis not present

## 2022-12-23 MED ORDER — NOREPINEPHRINE 4 MG/250ML-% IV SOLN
0.0000 ug/min | INTRAVENOUS | Status: DC
  Administered 2022-12-23: 20 ug/min via INTRAVENOUS

## 2022-12-23 MED ORDER — EPINEPHRINE 1 MG/10ML IJ SOSY
PREFILLED_SYRINGE | INTRAMUSCULAR | Status: AC | PRN
  Administered 2022-12-23: 1 mg via INTRAVENOUS

## 2022-12-23 MED ORDER — EPINEPHRINE 1 MG/10ML IJ SOSY
PREFILLED_SYRINGE | INTRAMUSCULAR | Status: AC | PRN
  Administered 2022-12-23 (×2): 1 mg via INTRAVENOUS

## 2022-12-23 NOTE — ED Triage Notes (Signed)
Pt arrives from home via [facility], C/O d/t breathing difficulty.  Cardiac arrest on scene, CPR w/2 rounds of EPI.  14 min asystole, 2 epis prior to rosc, 3rd when arrest in route. CBG 125.  HX COPD, cardiac stent. Arrives w/pulse and unresponsive.

## 2022-12-23 NOTE — ED Notes (Signed)
Faint pulse palpated 

## 2022-12-24 ENCOUNTER — Emergency Department: Payer: 59

## 2022-12-24 DIAGNOSIS — J939 Pneumothorax, unspecified: Secondary | ICD-10-CM | POA: Diagnosis not present

## 2022-12-24 DIAGNOSIS — I469 Cardiac arrest, cause unspecified: Secondary | ICD-10-CM | POA: Diagnosis not present

## 2022-12-24 DIAGNOSIS — Z4682 Encounter for fitting and adjustment of non-vascular catheter: Secondary | ICD-10-CM | POA: Diagnosis not present

## 2022-12-24 DIAGNOSIS — T797XXA Traumatic subcutaneous emphysema, initial encounter: Secondary | ICD-10-CM | POA: Diagnosis not present

## 2022-12-24 LAB — COMPREHENSIVE METABOLIC PANEL
ALT: 105 U/L — ABNORMAL HIGH (ref 0–44)
AST: 166 U/L — ABNORMAL HIGH (ref 15–41)
Albumin: 3.5 g/dL (ref 3.5–5.0)
Alkaline Phosphatase: 83 U/L (ref 38–126)
Anion gap: 10 (ref 5–15)
BUN: 24 mg/dL — ABNORMAL HIGH (ref 8–23)
CO2: 26 mmol/L (ref 22–32)
Calcium: 9.1 mg/dL (ref 8.9–10.3)
Chloride: 104 mmol/L (ref 98–111)
Creatinine, Ser: 1.04 mg/dL — ABNORMAL HIGH (ref 0.44–1.00)
GFR, Estimated: 56 mL/min — ABNORMAL LOW (ref >60)
Glucose, Bld: 210 mg/dL — ABNORMAL HIGH (ref 70–99)
Potassium: 6 mmol/L — ABNORMAL HIGH (ref 3.5–5.1)
Sodium: 140 mmol/L (ref 135–145)
Total Bilirubin: 0.5 mg/dL (ref 0.3–1.2)
Total Protein: 6.3 g/dL — ABNORMAL LOW (ref 6.5–8.1)

## 2022-12-24 LAB — BLOOD GAS, ARTERIAL
FIO2: 100 %
MECHVT: 450 mL
Mechanical Rate: 18
O2 Saturation: 100 %
PEEP: 10 cmH2O
Patient temperature: 37
pH, Arterial: <6.95 — CL (ref 7.35–7.45)
pO2, Arterial: 219 mm[Hg] — ABNORMAL HIGH (ref 83–108)

## 2022-12-24 LAB — TROPONIN I (HIGH SENSITIVITY): Troponin I (High Sensitivity): 70 ng/L — ABNORMAL HIGH (ref <18)

## 2022-12-24 LAB — CBC WITH DIFFERENTIAL/PLATELET
Abs Immature Granulocytes: 0.68 10*3/uL — ABNORMAL HIGH (ref 0.00–0.07)
Basophils Absolute: 0.2 10*3/uL — ABNORMAL HIGH (ref 0.0–0.1)
Basophils Relative: 1 %
Eosinophils Absolute: 0.5 10*3/uL (ref 0.0–0.5)
Eosinophils Relative: 4 %
HCT: 44.1 % (ref 36.0–46.0)
Hemoglobin: 13.4 g/dL (ref 12.0–15.0)
Immature Granulocytes: 5 %
Lymphocytes Relative: 48 %
Lymphs Abs: 6.9 10*3/uL — ABNORMAL HIGH (ref 0.7–4.0)
MCH: 32.3 pg (ref 26.0–34.0)
MCHC: 30.4 g/dL (ref 30.0–36.0)
MCV: 106.3 fL — ABNORMAL HIGH (ref 80.0–100.0)
Monocytes Absolute: 0.5 10*3/uL (ref 0.1–1.0)
Monocytes Relative: 4 %
Neutro Abs: 5.3 10*3/uL (ref 1.7–7.7)
Neutrophils Relative %: 38 %
Platelets: 183 10*3/uL (ref 150–400)
RBC: 4.15 MIL/uL (ref 3.87–5.11)
RDW: 12.6 % (ref 11.5–15.5)
WBC: 14.1 10*3/uL — ABNORMAL HIGH (ref 4.0–10.5)
nRBC: 0.3 % — ABNORMAL HIGH (ref 0.0–0.2)

## 2022-12-27 ENCOUNTER — Institutional Professional Consult (permissible substitution): Payer: 59 | Admitting: Pulmonary Disease

## 2023-01-03 DIAGNOSIS — J449 Chronic obstructive pulmonary disease, unspecified: Secondary | ICD-10-CM | POA: Diagnosis not present

## 2023-01-05 ENCOUNTER — Ambulatory Visit: Payer: 59 | Admitting: Physician Assistant

## 2023-01-21 NOTE — ED Notes (Signed)
2 epi at 2352 not given/duplicate entries.  2 Epi total given during code.

## 2023-01-21 NOTE — ED Notes (Signed)
Compression stopped, TOD 0035 per EDP Forach verbal.

## 2023-01-21 NOTE — ED Notes (Signed)
Pt transported to morgue by [facility] transport at this time.

## 2023-01-21 NOTE — ED Provider Notes (Signed)
[facility] Provider Note    Event Date/Time   First MD Initiated Contact with Patient 01/05/2023 0012     (approximate)   History   Cardiac Arrest   HPI Madison Frank is a 77 y.o. female with a history of COPD and cardiac disease.  She presents by EMS after a witnessed arrest.  Reportedly she was in severe respiratory distress which was impetus behind the EMS called by family.  About the time that the first responders and paramedics arrived, the patient had a full cardiopulmonary arrest.  She was not breathing and had asystole on the monitor.  Paramedics performed CPR and got a pulse back but then she lost her pulse again and route to the hospital.  CPR was performed again and she had a pulse upon arrival.  Unclear total downtime, but about 14 minutes of CPR with 2 rounds of epinephrine given in the field.  Upon arrival the patient has a pulse but is otherwise completely unresponsive.     Physical Exam   Triage Vital Signs: ED Triage Vitals [01/16/2023 2345]  Encounter Vitals Group     BP      Systolic BP Percentile      Diastolic BP Percentile      Pulse Rate (!) 50     Resp 15     Temp      Temp src      SpO2 (!) 63 %     Weight      Height      Head Circumference      Peak Flow      Pain Score      Pain Loc      Pain Education      Exclude from Growth Chart     Most recent vital signs: Vitals:   01/12/2023 2356 01/14/2023 0000  Pulse: 65 (!) 49  Resp:  18  SpO2:  90%    General: Unresponsive, pallor, GCS 3 CV:  Poor peripheral perfusion, cool to the touch.  Bradycardia typically in the 40s, typically in a bigeminy pattern but very erratic. Resp:  Equal but diminished breath sounds bilaterally, difficult to bag with BVM, but equal rise and fall of chest wall with bagging.  Patient sounds very tight consistent with COPD.  Confirmed both with me and with respiratory therapy. Abd:  Positive abdominal distention likely from bagging.   ED  Results / Procedures / Treatments   Labs (all labs ordered are listed, but only abnormal results are displayed) Labs Reviewed  CBC WITH DIFFERENTIAL/PLATELET - Abnormal; Notable for the following components:      Result Value   WBC 14.1 (*)    MCV 106.3 (*)    nRBC 0.3 (*)    Lymphs Abs 6.9 (*)    Basophils Absolute 0.2 (*)    Abs Immature Granulocytes 0.68 (*)    All other components within normal limits  COMPREHENSIVE METABOLIC PANEL - Abnormal; Notable for the following components:   Potassium 6.0 (*)    Glucose, Bld 210 (*)    BUN 24 (*)    Creatinine, Ser 1.04 (*)    Total Protein 6.3 (*)    AST 166 (*)    ALT 105 (*)    GFR, Estimated 56 (*)    All other components within normal limits  BLOOD GAS, ARTERIAL - Abnormal; Notable for the following components:   pO2, Arterial 219 (*)    All other components within normal limits  TROPONIN I (HIGH SENSITIVITY) - Abnormal; Notable for the following components:   Troponin I (High Sensitivity) 70 (*)    All other components within normal limits  PROTIME-INR  APTT     RADIOLOGY I viewed and interpreted the patient's chest x-ray and abdominal x-ray although they were available after I called time of death.  Patient has a small to medium size pneumothorax on the left after needle decompression.  Radiologist called me and we discussed the case and he confirmed the presence of the pneumothorax.  I also viewed and interpreted the abdominal x-ray which shows appropriate placement of OG tube.   PROCEDURES:  Critical Care performed: Yes, see critical care procedure note(s)  .1-3 Lead EKG Interpretation  Performed by: Loleta Rose, MD Authorized by: Loleta Rose, MD     Interpretation: abnormal     ECG rate:  38   ECG rate assessment: bradycardic     Rhythm: sinus rhythm     Ectopy: bigeminy   .Critical Care  Performed by: Loleta Rose, MD Authorized by: Loleta Rose, MD   Critical care provider statement:    Critical  care time (minutes):  75   Critical care time was exclusive of:  Separately billable procedures and treating other patients   Critical care was necessary to treat or prevent imminent or life-threatening deterioration of the following conditions:  Cardiac failure, circulatory failure, respiratory failure and CNS failure or compromise   Critical care was time spent personally by me on the following activities:  Development of treatment plan with patient or surrogate, evaluation of patient's response to treatment, examination of patient, obtaining history from patient or surrogate, ordering and performing treatments and interventions, ordering and review of laboratory studies, ordering and review of radiographic studies, pulse oximetry, re-evaluation of patient's condition and review of old charts Procedure Name: Intubation Date/Time: 01/02/2023 11:45 PM  Performed by: Loleta Rose, MDPre-anesthesia Checklist: Patient identified, Emergency Drugs available, Suction available and Patient being monitored Oxygen Delivery Method: Ambu bag Preoxygenation: Pre-oxygenation with 100% oxygen Induction Type: IV induction and Rapid sequence Ventilation: Mask ventilation with difficulty and Two handed mask ventilation required Laryngoscope Size: Glidescope and 3 Tube size: 7.5 mm Number of attempts: 1 Placement Confirmation: ETT inserted through vocal cords under direct vision, CO2 detector and Breath sounds checked- equal and bilateral Secured at: 23 cm Tube secured with: ETT holder Dental Injury: Teeth and Oropharynx as per pre-operative assessment     CPR  Date/Time: 01/13/2023 1:03 AM  Performed by: Loleta Rose, MD Authorized by: Loleta Rose, MD  CPR Procedure Details:      Amount of time prior to administration of ACLS/BLS (minutes):  14   ACLS/BLS initiated by EMS: Yes     CPR/ACLS performed in the ED: Yes     Outcome: Pt declared dead    CPR performed via ACLS guidelines under my direct  supervision.  See RN documentation for details including defibrillator use, medications, doses and timing. Comments:     See nursing notes for additional details - multiple rounds of CPR performed Needle decompression  Date/Time: 01/10/2023 12:30 AM  Performed by: Loleta Rose, MD Authorized by: Loleta Rose, MD  Consent: The procedure was performed in an emergent situation. Patient tolerance: patient tolerated the procedure well with no immediate complications Comments: Bilateral.  No return of air or blood on right, but minimal air and blood expressed from left catheter       IMPRESSION / MDM / ASSESSMENT AND PLAN / ED COURSE  I reviewed the triage vital signs and the nursing notes.                              Differential diagnosis includes, but is not limited to, cardiopulmonary collapse secondary to respiratory arrest, COPD exacerbation, ACS, PE  Patient's presentation is most consistent with acute presentation with potential threat to life or bodily function.  Labs/studies ordered: Chest x-ray, abdominal x-ray, CBC with differential, CMP, ABG, high-sensitivity troponin  Interventions/Medications given:  Medications  norepinephrine (LEVOPHED) 4mg  in (0.016 mg/mL) premix infusion (0 mcg/min Intravenous Stopped 01/09/2023 0035)  EPINEPHrine (ADRENALIN) 1 MG/10ML injection (1 mg Intravenous Given 01/14/2023 2341)  EPINEPHrine (ADRENALIN) 1 MG/10ML injection ( Intravenous Canceled Entry 12/27/2022 2352)  EPINEPHrine (ADRENALIN) 1 MG/10ML injection (1 mg Intravenous Given 01/08/2023 2351)    (Note:  hospital course my include additional interventions and/or labs/studies not listed above.)   Patient unresponsive with GCS of 3 and toxic appearance upon arrival.  As we were getting her settled into the ED stretcher, she lost what had previously been an easily palpable femoral pulse.  We started CPR as per the nursing documentation and were able to get back a pulse.  I ordered  Levophed to try to support her pulse.  The patient was on the cardiac monitor to evaluate for evidence of arrhythmia and/or significant heart rate changes.  Of note, bagging and then ventilating the patient became increasingly difficult.  I conferred with respiratory therapy and although she had equal but diminished breath sounds initially, they became increasingly difficult to appreciate.  As a last ditch effort, I performed needle decompression bilaterally.  There was no return of air or blood on the right, but a small amount of air and blood ventilated from the left catheter.  I left it in place and if the family wishes to pursue aggressive care, she will require bilateral thoracostomies to replace the catheters that are currently in place and the left and right anterior midlines.  Going to speak with family, but patient has no response with extended hypoxic downtime, very poor prognosis, and will discuss medical futility with the family should she have any additional loss of pulse.   Clinical Course as of 01/19/2023 1610  Wynelle Link Dec 24, 2022  0027 Conversation with multiple family members including spouse and kids/grandkids.  Discussed that patient has pulse but I expressed concern about medical futility.  They are considering whether to pursue additional care or withdraw care. [CF]  0040 Patient lost pulse again at around 00 30.  Needle thoracostomy still in place on the left and ventilating.  Given the medical futility of the situation, maximal therapy already provided, extended downtime, etc., I called time of death of death as per nursing documentation.  I informed the family.  I discussed with them and they do not want an autopsy.  I will touch base with medical examiner. [CF]  [id number] Consulted with on-call medical examiner.  I explained the clinical situation and she agreed that this was not a case for the medical examiner.  The patient's nurses and I removed the patient's lines and tubes and  prepared her for the family to be with her. [CF]    Clinical Course User Index [CF] Loleta Rose, MD     FINAL CLINICAL IMPRESSION(S) / ED DIAGNOSES   Final diagnoses:  Cardiopulmonary arrest [facility])     Rx / DC Orders   ED Discharge Orders  None        Note:  This document was prepared using Dragon voice recognition software and may include unintentional dictation errors.   Loleta Rose, MD 01/14/2023 [phone]

## 2023-01-21 NOTE — ED Notes (Signed)
Loss of pulse, compressions initiated.

## 2023-01-21 NOTE — Progress Notes (Signed)
Late entry: Patient was intubated by Dr. York Cerise on first attempt with size 7.5 ETT, 23 @ lip. Placed on ventilator for a short period of time, but removed after patient became pulseless. ACLS protocol followed, CO2 monitored. Removed patient off ventilator after code was called by MD.

## 2023-01-21 DEATH — deceased
